# Patient Record
Sex: Female | Born: 1965 | Race: White | Hispanic: No | Marital: Married | Smoking: Never smoker
Health system: Southern US, Community
[De-identification: ages and names within clinical notes are randomized; demographics above are authoritative.]

## PROBLEM LIST (undated history)

## (undated) HISTORY — PX: ABDOMINAL HYSTERECTOMY: SHX81

## (undated) HISTORY — PX: BREAST BIOPSY: SHX20

---

## 2004-12-16 ENCOUNTER — Emergency Department: Payer: Self-pay | Admitting: Emergency Medicine

## 2004-12-25 ENCOUNTER — Encounter: Payer: Self-pay | Admitting: Physician Assistant

## 2006-09-01 HISTORY — PX: HYSTEROTOMY: SHX1776

## 2007-07-21 ENCOUNTER — Ambulatory Visit: Payer: Self-pay | Admitting: Ophthalmology

## 2010-06-13 ENCOUNTER — Ambulatory Visit: Payer: Self-pay | Admitting: Family Medicine

## 2010-06-13 DIAGNOSIS — F17200 Nicotine dependence, unspecified, uncomplicated: Secondary | ICD-10-CM

## 2010-08-30 ENCOUNTER — Ambulatory Visit
Admission: RE | Admit: 2010-08-30 | Discharge: 2010-08-30 | Payer: Self-pay | Source: Home / Self Care | Attending: Family Medicine | Admitting: Family Medicine

## 2010-10-01 NOTE — Assessment & Plan Note (Signed)
Summary: CONGESTION/JBB   Vital Signs:  Patient Profile:   45 Years Old Female CC:      Cough and Left ear Ache / rwt Height:     64.5 inches Weight:      185 pounds BMI:     31.38 O2 Sat:      96 % O2 treatment:    Room Air Temp:     98.1 degrees F oral Pulse rate:   73 / minute Pulse rhythm:   regular Resp:     20 per minute BP sitting:   122 / 76  (right arm)  Pt. in pain?   yes    Location:   head    Intensity:   5    Type:       aching  Vitals Entered By: Levonne Spiller EMT-P (June 13, 2010 5:04 PM)              Is Patient Diabetic? No Comments Pt. is a smoker. 6-8 cigarettes per day.      Current Allergies: No known allergies History of Present Illness History from: patient Reason for visit: see chief complaint Chief Complaint: Cough and Left ear Ache / rwt History of Present Illness: This patient is a new patient to the clinic reporting that for the past week she has been experiencing symptoms of wheezing, coughing and runny nose and left ear pain which started more than one week ago. The patient is a smoker and reports less than one pack cigarettes  per day and has been smoking for more than 7 years. The patient reports no fever or chills but reports that she's having a productive cough of whitish sputum. The patient reports that the cough is worse in the morning and at night. The patient reports that she has been relatively healthy and does not take chronic medications. The patient reports that she has tried over-the-counter medications including Mucinex and medicine to help her sleep. She reports that none of these medications have helped.  REVIEW OF SYSTEMS Constitutional Symptoms      Denies fever, chills, night sweats, weight loss, weight gain, and fatigue.  Eyes       Denies change in vision, eye pain, eye discharge, glasses, contact lenses, and eye surgery. Ear/Nose/Throat/Mouth       Complains of ear pain and sore throat.      Denies hearing loss/aids,  change in hearing, ear discharge, dizziness, frequent runny nose, frequent nose bleeds, sinus problems, hoarseness, and tooth pain or bleeding.  Respiratory       Complains of dry cough, productive cough, wheezing, and shortness of breath.      Denies asthma, bronchitis, and emphysema/COPD.  Cardiovascular       Denies murmurs, chest pain, and tires easily with exhertion.    Gastrointestinal       Denies stomach pain, nausea/vomiting, diarrhea, constipation, blood in bowel movements, and indigestion. Genitourniary       Denies painful urination, kidney stones, and loss of urinary control. Neurological       Denies paralysis, seizures, and fainting/blackouts. Musculoskeletal       Denies muscle pain, joint pain, joint stiffness, decreased range of motion, redness, swelling, muscle weakness, and gout.  Skin       Denies bruising, unusual mles/lumps or sores, and hair/skin or nail changes.  Psych       Denies mood changes, temper/anger issues, anxiety/stress, speech problems, depression, and sleep problems.  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 The patient works in [employer]. The patient reports smoking less than one pack of cigarettes per day for over 7 days and denies alcohol and recreational drug use. 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 The patient works in [employer]. The patient reports smoking less than one pack of cigarettes per day for  over 7 days and denies alcohol and recreational drug use. Current Smoker Alcohol use-no Drug use-no Smoking Status:  current Drug Use:  no Packs/Day:  0.25 Physical Exam General appearance: well developed, well nourished, no acute distress Head: normocephalic, atraumatic Eyes: conjunctivae and lids normal Pupils: equal, round, reactive to light Ears: excessive cerumen left Nasal: swollen red turbinates with congestion Oral/Pharynx: tongue normal, posterior pharynx without erythema or exudate Neck: neck supple,  trachea midline, no masses Chest/Lungs: scattered wheezes throughout all lobes Heart: regular rate and  rhythm, no murmur Abdomen: soft, non-tender without obvious organomegaly Extremities: normal extremities Neurological: grossly intact and non-focal Skin: no obvious rashes or lesions MSE: oriented to time, place, and person Assessment New Problems: ACUTE SINUSITIS, UNSPECIFIED (ICD-461.9) ACUTE BRONCHITIS (ICD-466.0) SMOKER (ICD-305.1) FAMILY HISTORY DIABETES 1ST DEGREE RELATIVE (ICD-V18.0)   Patient Education: Patient and/or caregiver instructed in the following: fluids, Ibuprofen prn, rest fluids and Tylenol. The patient was counseled and advised to stop using all tobacco products.  Medical assistance was offered and the patient was encouraged to call 1-800-QUIT-NOW to get a smoking cessation coach.    Demonstrates willingness to comply.  Plan New Medications/Changes: FLUTICASONE PROPIONATE 50 MCG/ACT SUSP (FLUTICASONE PROPIONATE) 2 sprays per nostril once daily  #1 x 0, 06/13/2010, Clanford Johnson MD GUIATUSS AC 100-10 MG/5ML SYRP (GUAIFENESIN-CODEINE) take 1 teaspoon by mouth every 6-8 hours as needed severe cough and congestion, Will Cause Drowsiness  #100 mL x 0, 06/13/2010, Clanford Johnson MD BIAXIN XL PAC 500 MG XR24H-TAB (CLARITHROMYCIN) take 2 tabs by mouth every morning with breakfast  #20 x 0, 06/13/2010, Standley Dakins MD  New Orders: Nebulizer Tx  [phone] Follow Up: Follow up on an as needed basis, Follow up with Primary Physician  The patient and/or caregiver has been counseled thoroughly with regard to medications prescribed including dosage, schedule, interactions, rationale for use, and possible side effects and they verbalize understanding.  Diagnoses and expected course of recovery discussed and will return if not improved as expected or if the condition worsens. Patient and/or caregiver verbalized understanding.  Prescriptions: FLUTICASONE PROPIONATE 50 MCG/ACT SUSP (FLUTICASONE PROPIONATE) 2 sprays per nostril once daily  #1 x 0   Entered and Authorized by:   Standley Dakins MD   Signed by:   Standley Dakins MD on 06/13/2010   Method used:   Electronically to        [facility] [address] (retail)       [address], [address]       [place], [place]  [address]       Ph: [phone]       Fax: [phone]   RxID:   [id number] GUIATUSS AC 100-10 MG/5ML SYRP (  GUAIFENESIN-CODEINE) take 1 teaspoon by mouth every 6-8 hours as needed severe cough and congestion, Will Cause Drowsiness  #100 mL x 0   Entered and Authorized by:   Standley Dakins MD   Signed by:   Standley Dakins MD on 06/13/2010   Method used:   Print then Give to Patient   RxID:   [id number] BIAXIN XL PAC 500 MG XR24H-TAB (CLARITHROMYCIN) take 2 tabs by mouth every morning with breakfast  #20 x 0   Entered and Authorized by:   Standley Dakins MD   Signed by:   Standley Dakins MD on 06/13/2010   Method used:   Electronically to        [facility] [address] (retail)       [address], [address]       [place], [place]  [address]       Ph: [phone]       Fax: [phone]   RxID:   [id number]   Patient Instructions: 1)  The patient was counseled and advised to stop using all tobacco products.  Medical assistance was offered and the patient was encouraged to call 1-800-QUIT-NOW  to get a smoking cessation coach.    2)  The risks, benefits and possible side effects were clearly explained and discussed with the patient.  The patient verbalized clear understanding.  The patient was given instructions to return if symptoms don't improve, worsen or new changes develop.  If it is not during clinic hours and the patient cannot get back to this clinic then the patient was told to seek medical care at an available urgent care or emergency department.  The patient verbalized understanding.   3)  The patient was informed that there is no on-call provider or services available at this clinic during off-hours (when the clinic is closed).  If the patient developed a problem or concern that required immediate attention, the patient was advised to go the the nearest available urgent care or emergency department for medical care.  The patient verbalized understanding.    4)  It was clearly explained to the patient that this [facility] is not intended to be a primary care clinic.  The patient is always better served by the continuity of care and the provider/patient relationships developed with their dedicated primary care provider.  The patient was told to be sure to follow up as soon as possible with their primary care provider to discuss treatments received and to receive further examination and testing.  The patient verbalized understanding. The will f/u with PCP ASAP.  5)  Tobacco is very bad for your health and your loved ones! You Should stop smoking!. 6)  Stop Smoking Tips: Choose a Quit date. Cut down before the Quit date. decide what you will do as a substitute when you feel the urge to smoke (gum,toothpick,exercise).  Medication Administration  Medication # 1:    Medication: Atrovent 1mg  (Neb)  Medication # 2:    Medication: Albuterol Sulfate Sol 1mg  unit dose  Orders Added: 1)  Nebulizer Tx [94640]    Preventive Screening-Counseling & Management  Alcohol-Tobacco      Smoking Status: current     Smoking Cessation Counseling: yes     Smoke Cessation Stage: precontemplative     Packs/Day: 0.25     Year Started: 2005     Tobacco Counseling: to quit use of tobacco products      Drug Use:  no.     After the nebulizer treatment was completed, I listened to the patient's lungs and she had improved air movement bilaterally with much less wheezing.  Rodney Langton, M.D., F.A.A.F.P.  June 13, 2010

## 2010-10-03 NOTE — Assessment & Plan Note (Signed)
Summary: COUGH AND COLD/EVM   Vital Signs:  Patient profile:   45 year old female Height:      65 inches Weight:      189 pounds BMI:     31.56 O2 Sat:      97 % on Room air Temp:     98.4 degrees F oral Pulse rate:   78 / minute Pulse rhythm:   regular Resp:     22 per minute BP sitting:   116 / 64  (right arm)  Vitals Entered By: Levonne Spiller EMT-P (August 30, 2010 4:21 PM)  O2 Flow:  Room air CC: URI/ Cold Is Patient Diabetic? No Pain Assessment Patient in pain? no       Does patient need assistance? Functional Status Self care Ambulation Normal Comments Pt. is a smoker. 1 half pack per day. Pt. has minor wheezing Right lower lobe.   Chief Complaint:  URI/ Cold.  History of Present Illness: Pt says that she has been coughing since Christmas eve with wheezing and congestion, worse than it was when she seen here in October.  Pt did have a fever when it first started and then took tylenol and it improved.  Pt is coughing but not having any production.  She is having yellow/green sinus drainage and reports congestion in chest but nothing has been coming up.  pt says that she has been trying to to treat the infection on her own but no significant improvement with over-the-counter medications. She reports that she's been taking decongestants and over-the-counter cough medications but no significant improvement. She reports that she came in because she did not want to develop into a worse infection or pneumonia. She reports that she has been exposed to some sick people at home and at work. She reports that her son has also been sick with a upper respiratory infection.   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  The patient works in [employer]. The patient reports smoking less than one pack of cigarettes per day for over 7 days and denies alcohol and recreational drug use. Current Smoker Alcohol use-no Drug use-no  Risk Factors: Smoking Status: current (08/30/2010) Packs/Day: 0.25 (06/13/2010)  Past Surgical History: Reviewed history from 06/13/2010 and no changes required. Tubal ligation - 1994  Family History: Reviewed history from 06/13/2010 and no changes required. 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  The patient works in [employer]. The patient reports smoking less than one pack of cigarettes per day for over 7 days and denies alcohol and recreational drug use. Current Smoker Alcohol use-no Drug use-no  Review of Systems General:  Denies chills, fatigue, fever, loss of appetite, malaise, sleep disorder, sweats, weakness, and weight loss. Eyes:  Denies blurring, discharge, double vision, eye irritation, eye pain, halos, itching, light sensitivity, red eye, vision loss-1 eye, and vision loss-both eyes. ENT:  Complains of nasal  congestion; denies decreased hearing, difficulty swallowing, ear discharge, earache, hoarseness, nosebleeds, postnasal drainage, ringing in ears, sinus pressure, and sore throat. CV:  Denies bluish discoloration of lips or nails, chest pain or discomfort, difficulty breathing at night, difficulty breathing while lying down, fainting, fatigue, leg cramps with exertion, lightheadness, near fainting, palpitations, shortness of  breath with exertion, swelling of feet, swelling of hands, and weight gain. Resp:  Complains of cough; denies chest discomfort, chest pain with inspiration, coughing up blood, excessive snoring, hypersomnolence, morning headaches, pleuritic, shortness of breath, and wheezing; Dry Cough. GI:  Denies abdominal pain, bloody stools, change in bowel habits, constipation, dark tarry stools, diarrhea, excessive appetite, gas, hemorrhoids, indigestion, loss of appetite, nausea, vomiting, vomiting blood, and yellowish skin color. GU:  Denies abnormal vaginal bleeding, decreased libido, discharge, dysuria, genital sores, hematuria, incontinence, nocturia, urinary frequency, and urinary hesitancy. MS:  Denies joint pain, joint redness, joint swelling, loss of strength, low back pain, mid back pain, muscle aches, muscle , cramps, muscle weakness, stiffness, and thoracic pain. Derm:  Denies changes in color of skin, changes in nail beds, dryness, excessive perspiration, flushing, hair loss, insect bite(s), itching, lesion(s), poor wound healing, and rash. Neuro:  Denies brief paralysis, difficulty with concentration, disturbances in coordination, falling down, headaches, inability to speak, memory loss, numbness, poor balance, seizures, sensation of room spinning, tingling, tremors, visual disturbances, and weakness. Psych:  Denies alternate hallucination ( auditory/visual), anxiety, depression, easily angered, easily tearful, irritability, mental problems, panic attacks, sense of great danger, suicidal thoughts/plans, thoughts of violence, unusual visions or sounds, and thoughts /plans of harming others. Endo:  Denies cold intolerance, excessive hunger, excessive thirst, excessive urination, heat intolerance, polyuria, and weight change. Heme:  Denies abnormal bruising, bleeding, enlarge lymph nodes, fevers, pallor, and skin discoloration. Allergy:  Denies hives or rash, itching eyes, persistent infections, seasonal  allergies, and sneezing.  Physical Exam  General:  Well-developed,well-nourished,in no acute distress; alert,appropriate and cooperative throughout examination Head:  Normocephalic and atraumatic without obvious abnormalities. No apparent alopecia or balding. Eyes:  No corneal or conjunctival inflammation noted. EOMI. Perrla. Funduscopic exam benign, without hemorrhages, exudates or papilledema. Vision grossly normal. Ears:  External ear exam shows no significant lesions or deformities.  Otoscopic examination reveals clear canals, tympanic membranes are intact bilaterally without bulging, retraction, inflammation or discharge. Hearing is grossly normal bilaterally. Nose:  bilateral swollen nasal turbinates with thick yellow mucus noted Mouth:  dry mucous membranes with same mildly erythematous posterior pharynx but no exudate seen Neck:  supple, full ROM, and no masses.   Lungs:  bilateral expiratory wheezes heard, , improved after nebulizer treatment.  the patient does have a frequent dry hacking cough Heart:  Normal rate and regular rhythm. S1 and S2 normal without gallop, murmur, click, rub or other extra sounds. Abdomen:  Bowel sounds positive,abdomen soft and non-tender without masses, organomegaly or hernias noted. Msk:  No deformity or scoliosis noted of thoracic or lumbar spine.   Extremities:  No clubbing, cyanosis, edema, or deformity noted with normal full range of motion of all joints.   Neurologic:  No cranial nerve deficits noted. Station and gait are normal. Plantar reflexes are down-going bilaterally. DTRs are symmetrical throughout. Sensory, motor and coordinative functions appear intact. Skin:  Intact without suspicious lesions or rashes Cervical Nodes:  mild anterior cervical adenopathy-nontender Psych:  Cognition and judgment appear intact. Alert and cooperative with normal attention span and concentration. No apparent delusions, illusions,  hallucinations   Problems:  Medical Problems Added: 1)  Dx of Acute Bronchitis  (ICD-466.0) 2)  Dx of Acute Sinusitis, Unspecified  (ICD-461.9)  Impression & Recommendations:  Problem # 1:  ACUTE BRONCHITIS (ICD-466.0)  Her updated medication list for this problem includes:        Azithromycin 250 Mg Tabs (Azithromycin) .Marland Kitchen... Take 2 tabs by mouth on day 1, then 1 tab by mouth daily for 4 days    Ventolin Hfa 108 (90 Base) Mcg/act Aers (Albuterol sulfate) .Marland Kitchen... 2 puffs every 4 hours as needed cough, sob, wheezing    Guiatuss Ac 100-10 Mg/95ml Syrp (Guaifenesin-codeine) .Marland Kitchen... Take 1 teaspoon by mouth every 4-6 hours as needed severe cough and congestion: caution will cause drowsiness The patient was counseled and advised to stop using all tobacco products.  Medical assistance was offered and the patient was encouraged to call 1-800-QUIT-NOW to get a smoking cessation coach.     Problem # 2:  ACUTE SINUSITIS, UNSPECIFIED (ICD-461.9)  Her updated medication list for this problem includes:        Azithromycin 250 Mg Tabs (Azithromycin) .Marland Kitchen... Take 2 tabs by mouth on day 1, then 1 tab by mouth daily for 4 days    Guiatuss Ac 100-10 Mg/62ml Syrp (Guaifenesin-codeine) .Marland Kitchen... Take 1 teaspoon by mouth every 4-6 hours as needed severe cough and congestion: caution will cause drowsiness  Problem # 3:  SMOKER (ICD-305.1) The patient was counseled and advised to stop using all tobacco products.  Medical assistance was offered and the patient was encouraged to call 1-800-QUIT-NOW to get a smoking cessation coach.     Complete Medication List: 1)  Coricidin Hbp Congestion/cough 10-200 Mg Caps (Dextromethorphan-guaifenesin) .Marland Kitchen.. 1 cap every 4-6 hours 2)  Tussin Dm 100-10 Mg/14ml Syrp (Dextromethorphan-guaifenesin) .... 2 tsp every 4-6 hours 3)  Azithromycin 250 Mg Tabs (Azithromycin) .... Take 2 tabs by mouth on day 1, then 1 tab by mouth daily for 4 days 4)  Ventolin Hfa 108 (90 Base) Mcg/act Aers  (Albuterol sulfate) .... 2 puffs every 4 hours as needed cough, sob, wheezing 5)  Guiatuss Ac 100-10 Mg/18ml Syrp (Guaifenesin-codeine) .... Take 1 teaspoon by mouth every 4-6 hours as needed severe cough and congestion: caution will cause drowsiness 6)  Medrol Dosepak 4 Mg Tabs (methylprednisolone)  .... Take as directed  Patient Instructions: 1)  Tobacco is very bad for your health and your loved ones! You Should stop smoking!. 2)  Stop Smoking Tips: Choose a Quit date. Cut down before the Quit date. decide what you will do as a substitute when you feel the urge to smoke(gum,toothpick,exercise). 3)  Take your antibiotic as prescribed until ALL of it is gone, but stop if you develop a rash or swelling and contact our office as soon as possible. 4)  Acute sinusitis symptoms for less than 10 days are not helped by antibiotics.Use warm moist compresses, and over the counter decongestants ( only as directed). Call if no improvement in 5-7 days, sooner if increasing pain, fever, or new symptoms. 5)  Acute bronchitis symptoms for less than 10 days are not helped by antibiotics. take over the counter cough medications. call if no improvment in  5-7 days, sooner if increasing cough, fever, or new symptoms( shortness of breath, chest pain). 6)  Return if symptoms worsen or don't improve. 7)  Please stop all of the over-the-counter medications that you have been taking. Prescriptions: MEDROL DOSEPAK 4 MG TABS (METHYLPREDNISOLONE) take as directed  #1 x 0   Entered and Authorized by:   [employer]  MD   Signed by:   Standley Dakins MD on 08/30/2010   Method used:   Print then Give to Patient   RxID:   [id number] GUIATUSS AC 100-10 MG/5ML SYRP (GUAIFENESIN-CODEINE) take 1 teaspoon by mouth every 4-6 hours as needed severe cough and congestion: Caution Will Cause Drowsiness  #60 mL x 0   Entered and Authorized by:   Standley Dakins MD   Signed by:   Standley Dakins MD on 08/30/2010   Method  used:   Print then Give to Patient   RxID:   [id number] VENTOLIN HFA 108 (90 BASE) MCG/ACT AERS (ALBUTEROL SULFATE) 2 puffs every 4 hours as needed Cough, SOB, wheezing  #1 x 0   Entered and Authorized by:   Standley Dakins MD   Signed by:   Standley Dakins MD on 08/30/2010   Method used:   [facility]ron[facility]        [facility]       [address]       [place], [place]  [address]       Ph: [phone]       Fax: [phone]   RxID:   [id number] AZITHROMYCIN 250 MG TABS (AZITHROMYCIN) take 2 tabs by mouth on day 1, then 1 tab by mouth daily for 4 days  #6 x 0   Entered and Authorized by:   Standley Dakins MD   Signed by:   Standley Dakins MD on 08/30/2010   Method used:   Electronically to        [facility]       [address]       [place], [place]  [address]       Ph: [phone]       Fax: [phone]   RxID:   [id number]    Medication Administration  Injection # 1:    Medication: Solumedrol up to 125mg     Diagnosis: URI    Route: IM    Site: LUOQ gluteus    Exp Date: 02/28/2013    Lot #: [id number]    Mfr: [email or web address]    Patient tolerated injection without complications    Given by: Levonne Spiller EMT-P (August 30, 2010 4:58 PM)  Medication # 1:    Medication: Albuterol Sulfate Sol 3.0mg     Diagnosis: Wheezing    Dose: 1    Route: inhaled    Exp Date: 12/30/2010    Lot #: [id number]    Mfr: SANDOZ    Patient tolerated medication without complications    Given by: Levonne Spiller EMT-P (August 30, 2010 5:00 PM)  Medication # 2:    Medication: Ipratropium inhalation sol.  0.5mg     Diagnosis: Wheezing    Dose: 1    Route: inhaled    Exp Date: 12/30/2010    Lot #: [id number]    Mfr: SANDOZ    Patient tolerated medication without complications    Given by: Levonne Spiller EMT-P (August 30, 2010 5:01 PM)

## 2010-10-16 ENCOUNTER — Ambulatory Visit (INDEPENDENT_AMBULATORY_CARE_PROVIDER_SITE_OTHER): Payer: 59 | Admitting: Family Medicine

## 2010-10-16 ENCOUNTER — Encounter: Payer: Self-pay | Admitting: Family Medicine

## 2010-10-16 DIAGNOSIS — IMO0002 Reserved for concepts with insufficient information to code with codable children: Secondary | ICD-10-CM

## 2010-10-16 DIAGNOSIS — A4902 Methicillin resistant Staphylococcus aureus infection, unspecified site: Secondary | ICD-10-CM

## 2010-10-23 NOTE — Letter (Signed)
Summary: Out of Work  [facility]  [address]   [place], [place] [address]   Phone: [phone]  Fax: [phone]    October 16, 2010   Employee:  ALLEENE STOY    To Whom It May Concern:   For Medical reasons, please excuse the above named employee from work for the following dates:  Start:   October 16, 2010    End:   October 18, 2010  If you need additional information, please feel free to contact our office.         Sincerely,    Standley Dakins MD

## 2010-10-23 NOTE — Assessment & Plan Note (Signed)
Summary: POSSIBLE SPIDER BITE/EVM   Vital Signs:  Patient profile:   45 year old female Temp:     98.9 degrees F oral Pulse rate:   88 / minute Resp:     14 per minute  Chief Complaint:  check spider bite.  History of Present Illness: The patient presented today for an evaluation of a possible spider bite on the right shoulder that has started getting larger in size and causing some pain and discomfort.  It has been red and hot and irritated and overnight became larger.  It did not respond to topical hydrocortisone creme that the patient had applied.  The patient said that she had been working outsided when she believed she may have been bitten by a spider or some type of insect.  She says that this morning she has been feeling sick to her stomach but no fever or chills.  No vomiting or diarrhea reported.  She is really afraid because her brother lost a  leg from a brown recluse spider bite and has since expired.    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  The patient works in [employer]. The patient reports smoking less than one pack of cigarettes per day for over 7 days and denies alcohol and recreational drug use. Current Smoker Alcohol use-no Drug use-no  Risk Factors: Smoking Status: current (08/30/2010) Packs/Day: 0.25 (06/13/2010)  Past Medical History: Reviewed history from 06/13/2010 and no changes required. Chronic Active Nicotine Dependence  Past Surgical History: Reviewed history from 06/13/2010 and no changes required. Tubal ligation - 1994  Family History: Reviewed history from 06/13/2010 and no changes required. Family History Diabetes 1st degree relative - both parents affected  Social  History: Reviewed history from 08/30/2010 and no changes required. 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  The patient works in [employer]. The patient reports smoking less than one pack of cigarettes per day for over 7 days and denies alcohol and recreational drug use. Current Smoker Alcohol use-no Drug use-no  Review of Systems General:  Complains of malaise; denies chills, fatigue, fever, loss of appetite, sleep disorder, sweats, weakness, and weight loss. Eyes:  Denies blurring, discharge, double vision, eye irritation, eye pain, halos, itching, light sensitivity, red eye, vision loss-1 eye, and vision loss-both eyes. ENT:  Denies decreased hearing, difficulty swallowing, ear discharge, earache, hoarseness, nasal congestion, nosebleeds, postnasal drainage, ringing in ears, sinus pressure, and sore throat. CV:  Denies bluish discoloration of lips or nails, chest pain or discomfort, difficulty breathing at night, difficulty breathing while lying down, fainting, fatigue, leg cramps with exertion, lightheadness, near fainting, palpitations, shortness of breath with exertion, swelling of feet, swelling of hands, and weight gain. Resp:  Denies chest discomfort, chest pain with inspiration, cough, coughing up blood, excessive snoring, hypersomnolence, morning headaches, pleuritic, shortness of breath, sputum productive, and wheezing. GI:  Denies abdominal pain, bloody stools, change in bowel habits, constipation, dark tarry stools, diarrhea, excessive appetite, gas, hemorrhoids, indigestion, loss of appetite, nausea, vomiting, vomiting blood, and yellowish skin color. GU:  Denies abnormal vaginal bleeding, decreased libido, discharge, dysuria, genital sores, hematuria, incontinence, nocturia, urinary frequency, and urinary hesitancy. MS:  Complains of joint redness; denies joint pain, joint swelling, loss of strength, low  back pain, mid back pain,  muscle aches, muscle , cramps, muscle weakness, stiffness, and thoracic pain; right shoulder at the area of the insect bite. Derm:  Complains of changes in color of skin, insect bite(s), lesion(s), and rash; right shoulder. Neuro:  Denies brief paralysis, difficulty with concentration, disturbances in coordination, falling down, headaches, inability to speak, memory loss, numbness, poor balance, seizures, sensation of room spinning, tingling, tremors, visual disturbances, and weakness. Psych:  Denies alternate hallucination ( auditory/visual), anxiety, depression, easily angered, easily tearful, irritability, mental problems, panic attacks, sense of great danger, suicidal thoughts/plans, thoughts of violence, unusual visions or sounds, and thoughts /plans of harming others. Endo:  Denies cold intolerance, excessive hunger, excessive thirst, excessive urination, heat intolerance, polyuria, and weight change.  Physical Exam  General:  Well-developed,well-nourished,in no acute distress; alert,appropriate and cooperative throughout examination Head:  Normocephalic and atraumatic without obvious abnormalities. No apparent alopecia or balding. Eyes:  No corneal or conjunctival inflammation noted. EOMI. Perrla. Funduscopic exam benign, without hemorrhages, exudates or papilledema. Vision grossly normal. Ears:  External ear exam shows no significant lesions or deformities.  Otoscopic examination reveals clear canals, tympanic membranes are intact bilaterally without bulging, retraction, inflammation or discharge. Hearing is grossly normal bilaterally. Nose:  bilateral swollen nasal turbinates with thick yellow mucus noted Msk:  red, hot, irritated area of cellulitis on top of right shoulder - round area about 5 cm in diameter, small area of puncture seen, small amount of secretion seen; tender to palpation, no fluctuant area or absecess formed that could be palpated; Pulses:  R and L  carotid,radial,femoral,dorsalis pedis and posterior tibial pulses are full and equal bilaterally Neurologic:  alert & oriented X3 and gait normal.     Impression & Recommendations:  Problem # 1:  CELLULITIS AND ABSCESS OF UPPER ARM AND FOREARM (ICD-682.3)  Her updated medication list for this problem includes:    Bactrim Ds 800-160 Mg Tabs (Sulfamethoxazole-trimethoprim) .Marland Kitchen... Take 1 by mouth two times a day until completed  I explained to the patient to please return if not getting better or if the area of infection is enlarging, becoming more painful or any new changes that might develop.  The patient verbalized clear understanding.    The risks, benefits and possible side effects were clearly explained and discussed with the patient.  The patient verbalized clear understanding.  The patient was given instructions to return if symptoms don't improve, worsen or new changes develop.  If it is not during clinic hours and the patient cannot get back to this clinic then the patient was told to seek medical care at an available urgent care or emergency department.  The patient verbalized understanding.    Problem # 2:  R/O MRSA (EAV-409.81) Precautions discussed with the patient.  The patient verbalized clear understanding.    Complete Medication List: 1)  Coricidin Hbp Congestion/cough 10-200 Mg Caps (Dextromethorphan-guaifenesin) .Marland Kitchen.. 1 cap every 4-6 hours 2)  Tussin Dm 100-10 Mg/15ml Syrp (Dextromethorphan-guaifenesin) .... 2 tsp every 4-6 hours 3)  Ventolin Hfa 108 (90 Base) Mcg/act Aers (Albuterol sulfate) .... 2 puffs every 4 hours as needed cough, sob, wheezing 4)  Medrol Dosepak 4 Mg Tabs (methylprednisolone)  .... Take as directed 5)  Bactrim Ds 800-160 Mg Tabs (Sulfamethoxazole-trimethoprim) .... Take 1 by mouth two times a day until completed 6)  Mupirocin 2 % Oint (Mupirocin) .... Apply to lesion two times a day until resolved  Patient Instructions: 1)  Go to the pharmacy and pick  up your  prescription (s).  It may take up to 30 mins for electronic prescriptions to be delivered to the pharmacy.  Please call if your pharmacy has not received your prescriptions after 30 minutes.   2)  Take your antibiotic as prescribed until ALL of it is gone, but stop if you develop a rash or swelling and contact our office as soon as possible. 3)  Take MRSA precautions as we discussed in clinic. 4)  Return or go to the ER if no improvement or symptoms getting worse.   5)  The patient was informed that there is no on-call provider or services available at this clinic during off-hours (when the clinic is closed).  If the patient developed a problem or concern that required immediate attention, the patient was advised to go the the nearest available urgent care or emergency department for medical care.  The patient verbalized understanding.    Prescriptions: MUPIROCIN 2 % OINT (MUPIROCIN) apply to lesion two times a day until resolved  #22 gm x 0   Entered and Authorized by:   Standley Dakins MD   Signed by:   Standley Dakins MD on 10/16/2010   Method used:   Electronically to        [facility] [address] (retail)       [address], [address]       [place], [place]  [address]       Ph: [phone]       Fax: [phone]   RxID:   [id number] BACTRIM DS 800-160 MG TABS (SULFAMETHOXAZOLE-TRIMETHOPRIM) take 1 by mouth two times a day until completed  #20 x 0   Entered and Authorized by:   Standley Dakins MD   Signed by:   Standley Dakins MD on 10/16/2010   Method used:   Electronically to        [facility] [address] (retail)       [address], [address]       [place], [place]  [address]       Ph: [phone]       Fax: [phone]   RxID:   [id number]   WOUND CARE - IN OFFICE  The wound was marked with black ink.  The wound was assessed carefully for areas of fluctuance and was treated with topical  antibiotic ointment and bandaged appropriately.  Wound care instructions provided to patient. The patient verbalized clear understanding.

## 2010-10-23 NOTE — Letter (Signed)
Summary: Work excuse note  Work excuse note   Imported By: Dorna Leitz 10/17/2010 14:49:10  _____________________________________________________________________  External Attachment:    Type:   Image     Comment:   External Document

## 2010-11-30 ENCOUNTER — Emergency Department: Payer: Self-pay | Admitting: Internal Medicine

## 2010-12-23 ENCOUNTER — Ambulatory Visit: Payer: Self-pay | Admitting: Emergency Medicine

## 2011-02-11 ENCOUNTER — Ambulatory Visit: Payer: Self-pay | Admitting: Emergency Medicine

## 2011-02-21 ENCOUNTER — Ambulatory Visit: Payer: Self-pay | Admitting: Emergency Medicine

## 2014-04-12 ENCOUNTER — Ambulatory Visit: Payer: Self-pay | Admitting: Nurse Practitioner

## 2014-05-04 ENCOUNTER — Ambulatory Visit: Payer: Self-pay | Admitting: Orthopedic Surgery

## 2014-10-23 ENCOUNTER — Ambulatory Visit: Payer: Self-pay | Admitting: Internal Medicine

## 2014-12-23 NOTE — Op Note (Signed)
PATIENT NAME:  Margaret [name], [name] MR#:  [id number] DATE OF BIRTH:  [birth year]  DATE OF PROCEDURE:  05/04/2014  PREOPERATIVE DIAGNOSIS: Right rotator cuff tear.   POSTOPERATIVE DIAGNOSIS: Right rotator cuff tear.  PROCEDURE: Right rotator cuff repair, subacromial decompression.   ANESTHESIA: General.   SURGEON: Kennedy BuckerMichael Anjanae Woehrle, M.D.   DESCRIPTION OF PROCEDURE: The patient was brought to the operating room and after adequate anesthesia was obtained, the patient was placed in a semi-beach chair position with a bump underneath the right shoulder blade. After patient identification and timeout procedures were completed and after sterilely prepping and draping the shoulder, a saber incision was made over the distal acromion and a deltoid on approach made to approach the subacromial space. After the subacromial space was entered, there was a large rotator cuff tear involving the anterior half of the supraspinatus and a portion of subscapularis. The tendon itself was retracted approximately 2 cm and was quite thin.  The biceps tendon was identified as well; it appeared intact in the proximal shoulder.  After debriding the prior attachment to the cuff and debriding the distal stumps of the tendon, a bony bed was created for subsequent attachment of the rotator cuff.  The edge of the torn cuff was also debrided to get a fresh margin.  A suture passer was placed and heavy suture was placed anterior, posterior, in the midportion of the cuff tear. It measured approximately 3.5 cm in length. Following this, three SpeedScrew screws were placed in similar positions.  The SpeedScrew screws were inserted close to the normal insertion approximately 5 mm proximal to the normal insertion of the cuff. The sutures were placed through the speed screws sequentially anterior, middle and then posterior and as they were cinched up they came adjacent to the SpeedScrew itself. There appeared to be good bone to tendon contact from these 3  sutures, but, again, the tendon itself was approximately a third to half normal thickness secondary to the presumed length of time of the tear. After this repair was complete and placing the shoulder through a range of motion, the wound was thoroughly irrigated. Additionally, at the start of the case, after first entering the subacromial space, an oscillating rasp was used to smooth the undersurface of the anterior acromion and a spur at the [facility]. Minimal resection was carried out.  After thorough irrigation, there appeared to be space for the cuff with range of motion and a thick cuff repair appeared stable. The wound was closed with 0 Ethibond for the deltoid, 2-0 Vicryl subcutaneously, and 4-0 nylon for the skin suture. Xeroform, 4 x 4's, ABD, and tape applied along with a sling. The patient was sent to the recovery room in stable condition.   ESTIMATED BLOOD LOSS: Minimal, 25 mL.   COMPLICATIONS: None.   SPECIMEN: None.   IMPLANTS:  ArthroCare SpeedScrew 5.5 mm, knotless anchor x 3.    ____________________________ Leitha SchullerMichael J. Arloa Prak, MD mjm:nr D: 05/04/2014 19:59:04 ET T: 05/04/2014 21:03:04 ET JOB#: [id number]  cc: Leitha SchullerMichael J. Tamari Redwine, MD, <Dictator> Leitha SchullerMICHAEL J Kye Silverstein MD ELECTRONICALLY SIGNED 05/05/2014 8:09

## 2015-02-21 ENCOUNTER — Other Ambulatory Visit: Payer: Self-pay | Admitting: Internal Medicine

## 2015-02-21 ENCOUNTER — Ambulatory Visit
Admission: RE | Admit: 2015-02-21 | Discharge: 2015-02-21 | Disposition: A | Discharge status: Home / Self Care | Payer: Commercial Managed Care - HMO | Source: Ambulatory Visit | Attending: Internal Medicine | Admitting: Internal Medicine

## 2015-02-21 DIAGNOSIS — R229 Localized swelling, mass and lump, unspecified: Secondary | ICD-10-CM | POA: Insufficient documentation

## 2015-08-07 IMAGING — MG MM DIGITAL SCREENING BILAT W/ CAD
5 series · 5 of 5 positions shown · non-contrast
Comparison: Previous exam(s).

CLINICAL DATA: Screening.

EXAM:
DIGITAL SCREENING BILATERAL MAMMOGRAM WITH CAD

[L MLO]
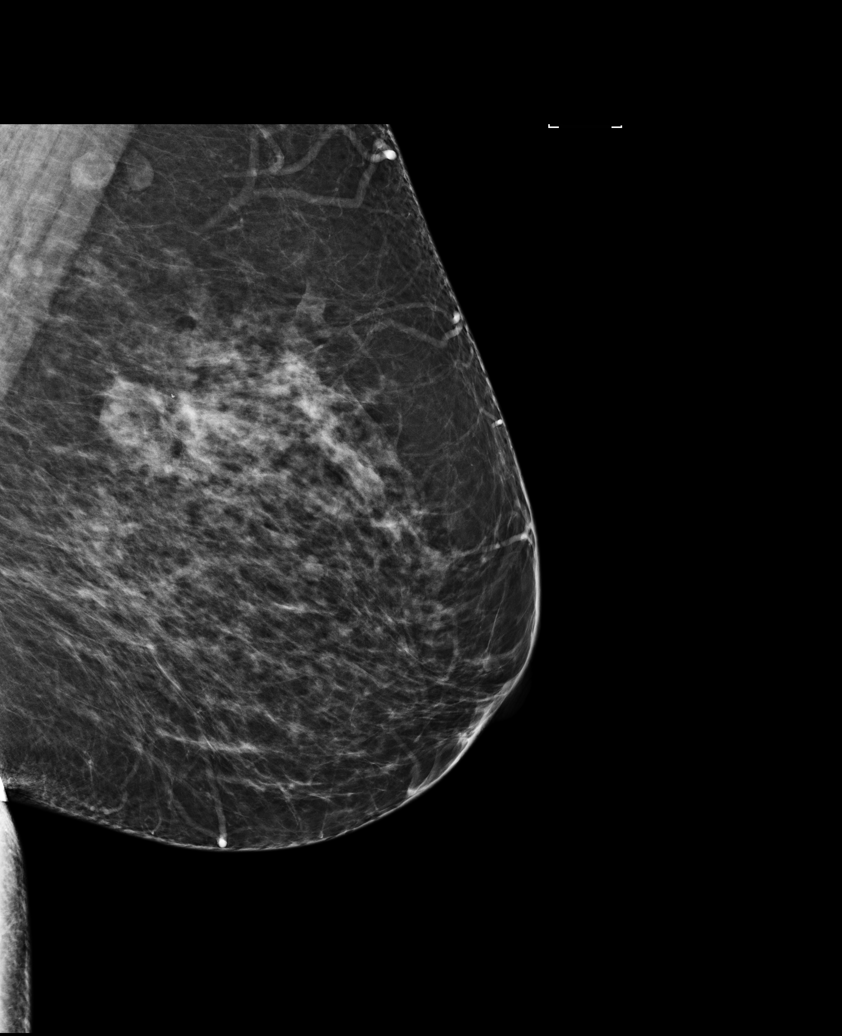

[R MLO (1 of 2)]
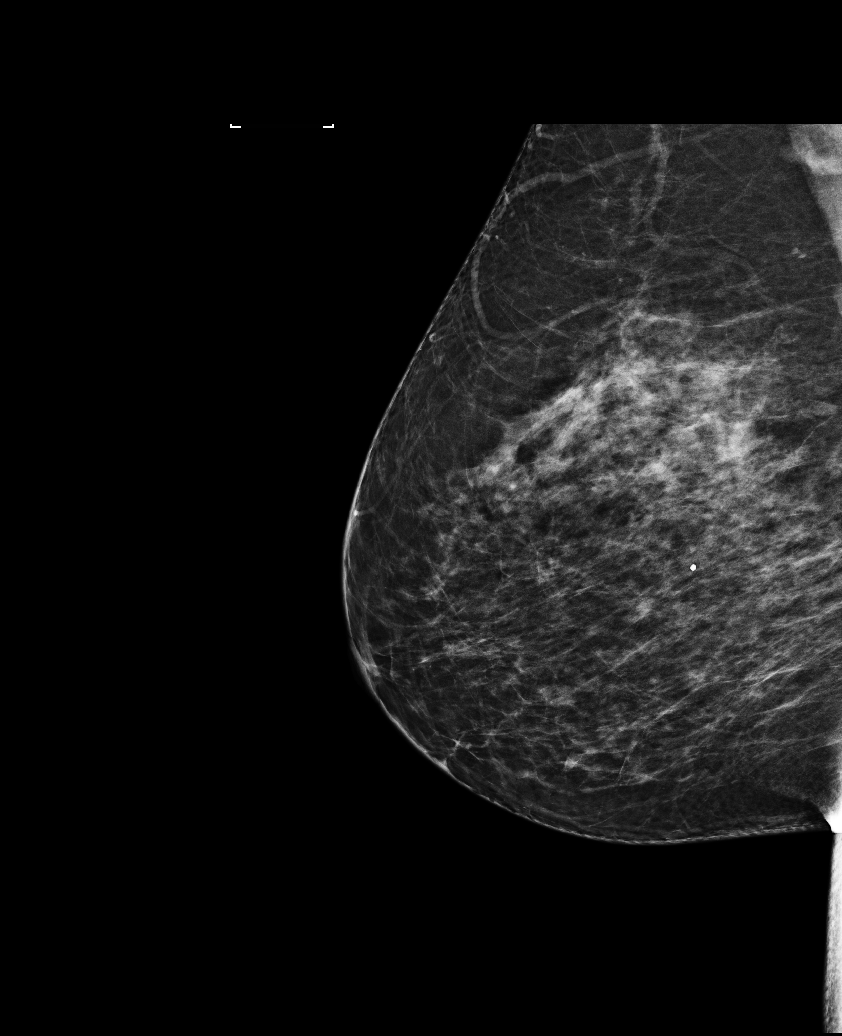

[L CC]
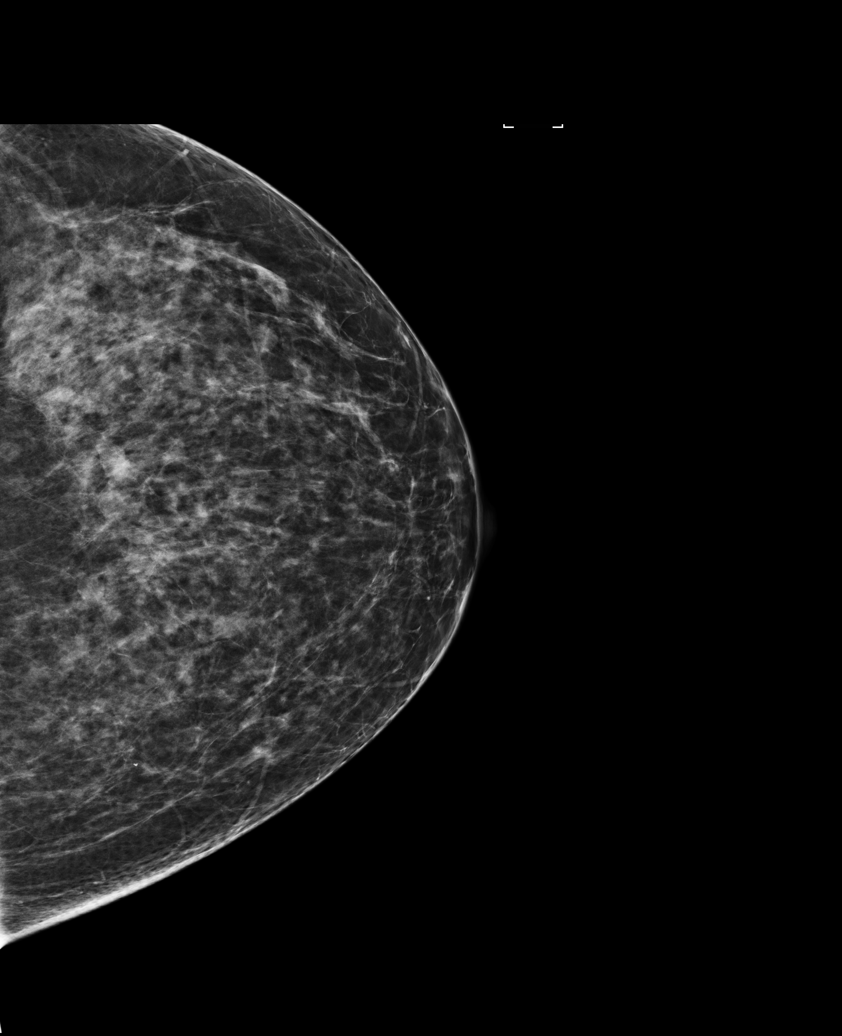

[R CC]
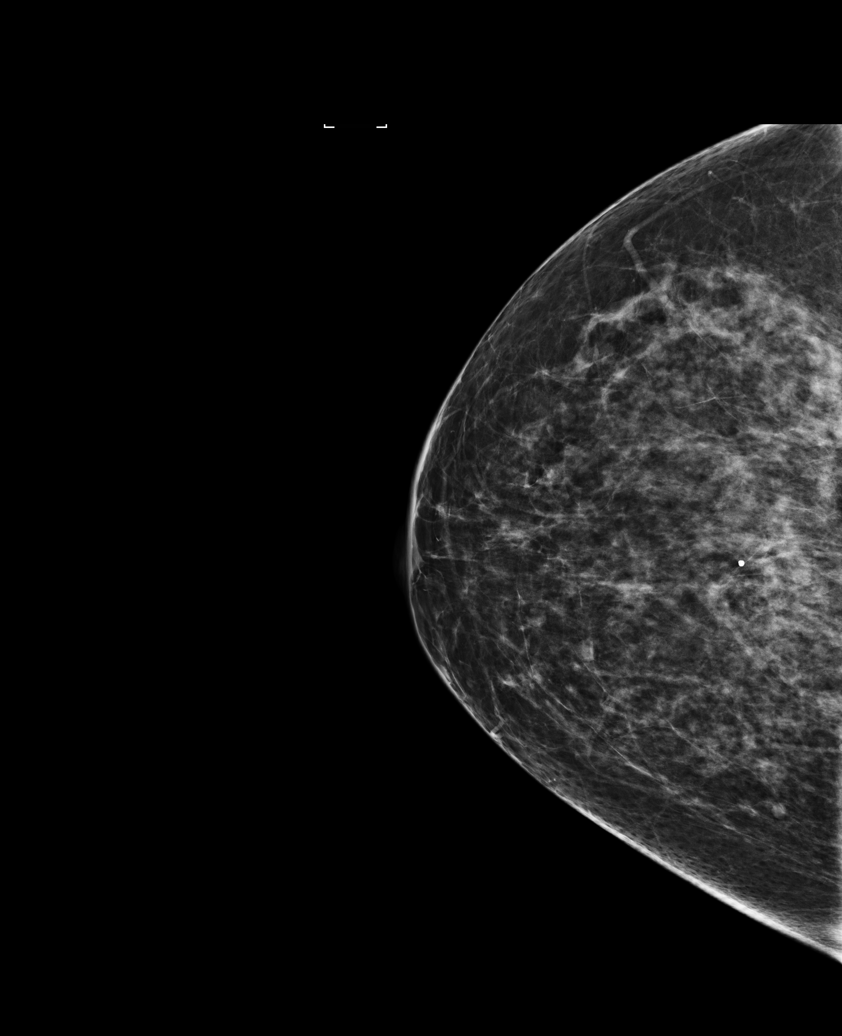

[R MLO (2 of 2)]
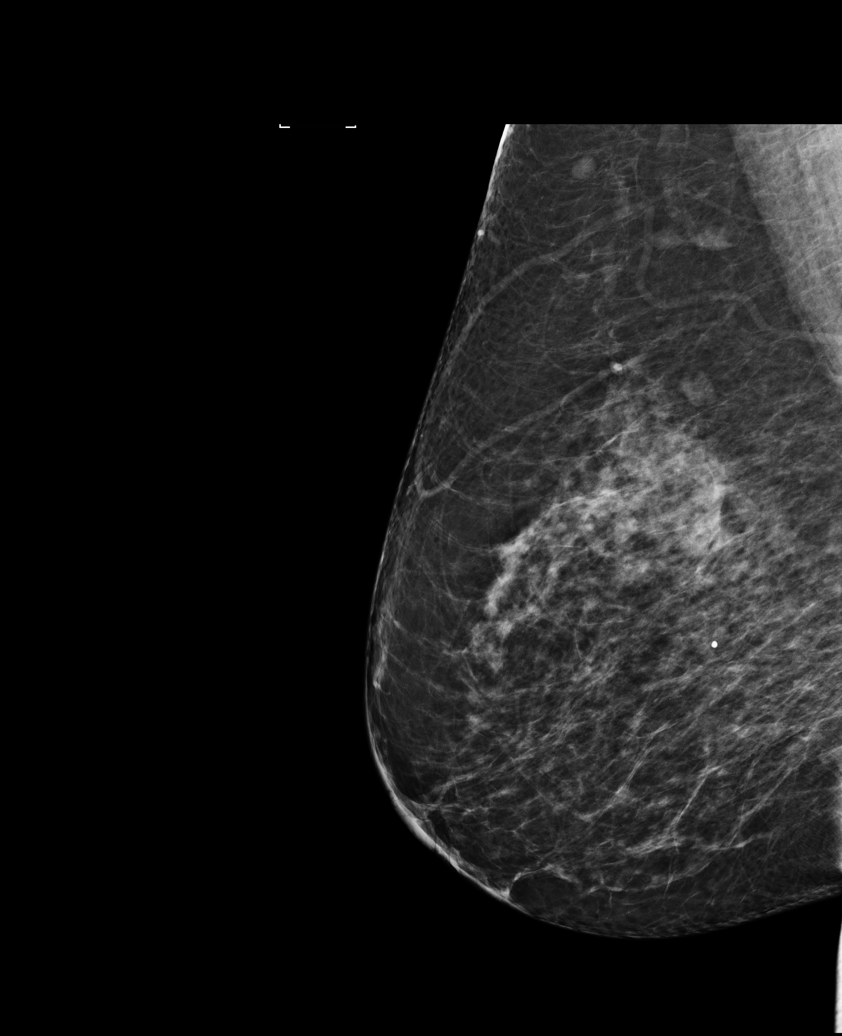

[5 of 5 positions shown; findings below may reference images not displayed]

ACR Breast Density Category c: The breast tissue is heterogeneously
dense, which may obscure small masses.
FINDINGS: There are no findings suspicious for malignancy. Images were
processed with CAD.
IMPRESSION: No mammographic evidence of malignancy. A result letter of this
screening mammogram will be mailed directly to the patient.

RECOMMENDATION:
Screening mammogram in one year. (Code:YJ-2-FEZ)

BI-RADS CATEGORY  1: Negative.

## 2016-11-02 ENCOUNTER — Emergency Department: Payer: 59

## 2016-11-02 ENCOUNTER — Encounter: Payer: Self-pay | Admitting: Emergency Medicine

## 2016-11-02 ENCOUNTER — Inpatient Hospital Stay
Admission: EM | Admit: 2016-11-02 | Discharge: 2016-11-04 | DRG: 494 | Disposition: A | Discharge status: Home / Self Care | Payer: 59 | Attending: Surgery | Admitting: Surgery

## 2016-11-02 DIAGNOSIS — Z886 Allergy status to analgesic agent status: Secondary | ICD-10-CM

## 2016-11-02 DIAGNOSIS — Z87891 Personal history of nicotine dependence: Secondary | ICD-10-CM | POA: Diagnosis not present

## 2016-11-02 DIAGNOSIS — S82891A Other fracture of right lower leg, initial encounter for closed fracture: Secondary | ICD-10-CM | POA: Diagnosis present

## 2016-11-02 DIAGNOSIS — S82851A Displaced trimalleolar fracture of right lower leg, initial encounter for closed fracture: Principal | ICD-10-CM | POA: Diagnosis present

## 2016-11-02 DIAGNOSIS — T148XXA Other injury of unspecified body region, initial encounter: Secondary | ICD-10-CM

## 2016-11-02 DIAGNOSIS — W172XXA Fall into hole, initial encounter: Secondary | ICD-10-CM | POA: Diagnosis present

## 2016-11-02 LAB — COMPREHENSIVE METABOLIC PANEL
ALT: 19 U/L (ref 14–54)
AST: 24 U/L (ref 15–41)
Albumin: 4.2 g/dL (ref 3.5–5.0)
Alkaline Phosphatase: 74 U/L (ref 38–126)
Anion gap: 9 (ref 5–15)
BUN: 16 mg/dL (ref 6–20)
CO2: 26 mmol/L (ref 22–32)
Calcium: 8.9 mg/dL (ref 8.9–10.3)
Chloride: 103 mmol/L (ref 101–111)
Creatinine, Ser: 0.56 mg/dL (ref 0.44–1.00)
GFR calc Af Amer: >60 mL/min (ref >60)
GFR calc non Af Amer: >60 mL/min (ref >60)
Glucose, Bld: 115 mg/dL — ABNORMAL HIGH (ref 65–99)
Potassium: 3.9 mmol/L (ref 3.5–5.1)
Sodium: 138 mmol/L (ref 135–145)
Total Bilirubin: 0.6 mg/dL (ref 0.3–1.2)
Total Protein: 7.3 g/dL (ref 6.5–8.1)

## 2016-11-02 LAB — CBC WITH DIFFERENTIAL/PLATELET
Basophils Absolute: 0.1 K/uL (ref 0–0.1)
Basophils Relative: 1 %
Eosinophils Absolute: 0 K/uL (ref 0–0.7)
Eosinophils Relative: 0 %
HCT: 43.5 % (ref 35.0–47.0)
Hemoglobin: 14.8 g/dL (ref 12.0–16.0)
Lymphocytes Relative: 10 %
Lymphs Abs: 1.6 K/uL (ref 1.0–3.6)
MCH: 28.6 pg (ref 26.0–34.0)
MCHC: 34 g/dL (ref 32.0–36.0)
MCV: 84.2 fL (ref 80.0–100.0)
Monocytes Absolute: 0.6 K/uL (ref 0.2–0.9)
Monocytes Relative: 4 %
Neutro Abs: 13.3 K/uL — ABNORMAL HIGH (ref 1.4–6.5)
Neutrophils Relative %: 85 %
Platelets: 261 K/uL (ref 150–440)
RBC: 5.17 MIL/uL (ref 3.80–5.20)
RDW: 13.1 % (ref 11.5–14.5)
WBC: 15.7 K/uL — ABNORMAL HIGH (ref 3.6–11.0)

## 2016-11-02 MED ORDER — HYDROMORPHONE HCL 1 MG/ML IJ SOLN
1.0000 mg | INTRAMUSCULAR | Status: DC | PRN
  Administered 2016-11-02: 1 mg via INTRAVENOUS
  Administered 2016-11-02 – 2016-11-03 (×5): 2 mg via INTRAVENOUS
  Filled 2016-11-02 (×5): qty 2

## 2016-11-02 MED ORDER — CEFAZOLIN (ANCEF) 1 G IV SOLR: 2.0000 g | INTRAVENOUS | Status: DC

## 2016-11-02 MED ORDER — ONDANSETRON HCL 4 MG/2ML IJ SOLN
4.0000 mg | Freq: Four times a day (QID) | INTRAMUSCULAR | Status: DC | PRN
  Administered 2016-11-02: 4 mg via INTRAVENOUS

## 2016-11-02 MED ORDER — HYDROMORPHONE HCL 1 MG/ML IJ SOLN
INTRAMUSCULAR | Status: AC
  Administered 2016-11-02: 2 mg via INTRAVENOUS
  Filled 2016-11-02: qty 1

## 2016-11-02 MED ORDER — ONDANSETRON HCL 4 MG/2ML IJ SOLN
INTRAMUSCULAR | Status: AC
  Filled 2016-11-02: qty 2

## 2016-11-02 MED ORDER — ONDANSETRON 4 MG PO TBDP
4.0000 mg | ORAL_TABLET | Freq: Four times a day (QID) | ORAL | Status: DC | PRN
  Filled 2016-11-02: qty 1

## 2016-11-02 MED ORDER — DOCUSATE SODIUM 100 MG PO CAPS: 100.0000 mg | ORAL_CAPSULE | Freq: Two times a day (BID) | ORAL | Status: DC

## 2016-11-02 MED ORDER — ACETAMINOPHEN 325 MG PO TABS: 650.0000 mg | ORAL_TABLET | Freq: Four times a day (QID) | ORAL | Status: DC | PRN

## 2016-11-02 MED ORDER — FLEET ENEMA 7-19 GM/118ML RE ENEM: 1.0000 | ENEMA | Freq: Once | RECTAL | Status: DC | PRN

## 2016-11-02 MED ORDER — PANTOPRAZOLE SODIUM 40 MG IV SOLR: 40.0000 mg | Freq: Every day | INTRAVENOUS | Status: DC

## 2016-11-02 MED ORDER — CEFAZOLIN SODIUM-DEXTROSE 2-4 GM/100ML-% IV SOLN: 2.0000 g | INTRAVENOUS | Status: DC

## 2016-11-02 MED ORDER — KETOROLAC TROMETHAMINE 30 MG/ML IJ SOLN
30.0000 mg | Freq: Once | INTRAMUSCULAR | Status: AC
  Administered 2016-11-02: 30 mg via INTRAMUSCULAR
  Filled 2016-11-02: qty 1

## 2016-11-02 MED ORDER — DEXTROSE 5 % IV BOLUS
2.0000 g | INTRAVENOUS | Status: DC
  Filled 2016-11-02: qty 2

## 2016-11-02 MED ORDER — HYDROMORPHONE HCL 1 MG/ML PO LIQD: 1.0000 mg | Freq: Once | ORAL | Status: DC

## 2016-11-02 MED ORDER — BISACODYL 10 MG RE SUPP: 10.0000 mg | Freq: Every day | RECTAL | Status: DC | PRN

## 2016-11-02 MED ORDER — HYDROMORPHONE HCL 1 MG/ML IJ SOLN
INTRAMUSCULAR | Status: AC
  Administered 2016-11-02: 1 mg via INTRAVENOUS
  Filled 2016-11-02: qty 1

## 2016-11-02 MED ORDER — KCL IN DEXTROSE-NACL 20-5-0.9 MEQ/L-%-% IV SOLN
INTRAVENOUS | Status: DC
  Administered 2016-11-03 (×2): via INTRAVENOUS
  Filled 2016-11-02 (×4): qty 1000

## 2016-11-02 MED ORDER — MAGNESIUM HYDROXIDE 400 MG/5ML PO SUSP: 30.0000 mL | Freq: Every day | ORAL | Status: DC | PRN

## 2016-11-02 MED ORDER — HYDROMORPHONE HCL 1 MG/ML IJ SOLN
1.0000 mg | Freq: Once | INTRAMUSCULAR | Status: AC
  Administered 2016-11-02: 1 mg via INTRAVENOUS

## 2016-11-02 MED ORDER — ACETAMINOPHEN 650 MG RE SUPP: 650.0000 mg | Freq: Four times a day (QID) | RECTAL | Status: DC | PRN

## 2016-11-02 MED ORDER — CEFAZOLIN SODIUM-DEXTROSE 2-4 GM/100ML-% IV SOLN
2.0000 g | Freq: Once | INTRAVENOUS | Status: DC
  Filled 2016-11-02: qty 100

## 2016-11-02 NOTE — ED Provider Notes (Signed)
ED ECG REPORT I, Arelia LongestSchaevitz,  Paitynn Mikus M, the attending physician, personally viewed and interpreted this ECG.   Date: 11/02/2016  EKG Time: 2032  Rate: 65  Rhythm: normal sinus rhythm  Axis: Normal  Intervals:none  ST&T Change: No ST segment elevation or depression. T-wave inversions in V2 and V3. No previous for comparison.   Myrna Blazeravid Matthew Jamerion Cabello, MD 11/02/16 2035

## 2016-11-02 NOTE — Progress Notes (Signed)
Pt is alert and oriented. VS stable. Sleeping. Foot pumps and teds applied. Consent signed.

## 2016-11-02 NOTE — H&P (Signed)
Subjective:  Chief complaint:  Right ankle injury.  The patient is a 51 y.o. female who sustained an injury to her right ankle earlier this evening when she apparently stepped into a hole in her yard. She was brought to the emergency room where x-rays demonstrated a fracture dislocation of the right ankle. The patient denies any associated injury. She did not strike her head or lose consciousness. She denies any light-headedness, dizziness, chest pain, or shortness of breath which might have contributed to the injury.  Patient Active Problem List   Diagnosis Date Noted  . SMOKER 06/13/2010   History reviewed. No pertinent past medical history.  History reviewed. No pertinent surgical history.   (Not in a hospital admission) Allergies  Allergen Reactions  . Codeine     Social History  Substance Use Topics  . Smoking status: Never Smoker  . Smokeless tobacco: Never Used  . Alcohol use Not on file    History reviewed. No pertinent family history.   Review of Systems: As noted above. The patient denies any chest pain, shortness of breath, nausea, vomiting, diarrhea, constipation, belly pain, blood in his/her stool, or burning with urination.  Objective: Temp:  [97.7 F (36.5 C)] 97.7 F (36.5 C) (03/04 1731) Pulse Rate:  [78] 78 (03/04 1731) Resp:  [22] 22 (03/04 1731) BP: (121)/(79) 121/79 (03/04 1731) SpO2:  [98 %] 98 % (03/04 1731) Weight:  [86.2 kg (190 lb)] 86.2 kg (190 lb) (03/04 1726)  Physical Exam: General:  Alert, no acute distress Psychiatric:  Patient is competent for consent with normal mood and affect  Cardiovascular:  RRR  Respiratory:  Clear to auscultation. No wheezing. Non-labored breathing GI:  Abdomen is soft and non-tender Skin:  No lesions in the area of chief complaint Neurologic:  Sensation intact distally Lymphatic:  No axillary or cervical lymphadenopathy  Orthopedic Exam:  Orthopedic examination is limited to the right foot and lower leg. There  is an obvious deformity of the ankle with moderate swelling noted. There are no skin violations or other skin abnormalities noted. The ankle was reduced and placed into a posterior splint with sugar tong supplement. Following reduction, the patient is able to dorsiflex and plantarflex her toes. Sensation was intact to light touch in all digits. She has good capillary refill to all digits.  Imaging Review: Recent x-rays of the right ankle are available for review.  These films demonstrate a displaced fracture dislocation of the right ankle with a long posterior oblique fracture of the distal fibula and a posterior malleolar fragment. The medial malleolus appears to be intact.Marland Kitchen.   Postreduction films demonstrate near anatomic alignment of the fractures in both the AP and lateral projections. The posterior malleolar fragment appears to involve approximately 25-30% of the posterior portion of the plafond.  Assessment: Closed bimalleolar fracture dislocation right ankle.   Plan: The treatment options have been discussed in detail with the patient and her family. given the severity of the injury, I feel that the patient be best managed by a formal open reduction and internal fixation of the fracture fragments. This procedure has been discussed in detail with the patient. The risks (including bleeding, infection, nerve and/or blood vessel injury, persistent or recurrent pain, loosening or failure of the components, leg length inequality, dislocation, need for further surgery, blood clots, strokes, heart attacks or arrhythmias, pneumonia, etc.) and benefits of this procedure also have been discussed. The patient states her understanding and agrees to proceed. A formal written consent will be  obtained by the nursing staff. She will be admitted at this time in preparation for this procedure to be done tomorrow.

## 2016-11-02 NOTE — ED Provider Notes (Signed)
[facility] Emergency Department Provider Note  ____________________________________________  Time seen: Approximately 5:53 PM  I have reviewed the triage vital signs and the nursing notes.   HISTORY  Chief Complaint Ankle Pain    HPI Margaret [name] is a 51 y.o. female presenting to the emergency department with 10 out of 10 right ankle pain. Patient states that she "fell in a hole" and sustained an inversion ankle injury, right. She states that she heard an audible "pop". Patient denies hitting her head during the incident. Patient denies prior traumas or surgeries to the right lower extremity. She has attempted no alleviating measures. Patient denies chest pain, chest tightness, shortness of breath, abdominal pain, nausea and vomiting.    History reviewed. No pertinent past medical history.  Patient Active Problem List   Diagnosis Date Noted  . Fracture dislocation of right ankle 11/02/2016  . SMOKER 06/13/2010    History reviewed. No pertinent surgical history.  Prior to Admission medications   Not on File    Allergies Tramadol; Codeine; and Oxycodone-acetaminophen  History reviewed. No pertinent family history.  Social History Social History  Substance Use Topics  . Smoking status: Never Smoker  . Smokeless tobacco: Never Used  . Alcohol use Not on file     Review of Systems  Constitutional: No fever/chills Eyes: No visual changes. No discharge ENT: No upper respiratory complaints. Cardiovascular: no chest pain. Respiratory: no cough. No SOB. Gastrointestinal: No abdominal pain.  No nausea, no vomiting.  No diarrhea.  No constipation. Musculoskeletal: Patient has right ankle pain.  Skin: Negative for rash, abrasions, lacerations, ecchymosis. Neurological: Negative for headaches, focal weakness or numbness. ____________________________________________   PHYSICAL EXAM:  VITAL SIGNS: ED Triage Vitals  Enc Vitals Group     BP  11/02/16 1731 121/79     Pulse Rate 11/02/16 1731 78     Resp 11/02/16 1731 (!) 22     Temp 11/02/16 1731 97.7 F (36.5 C)     Temp Source 11/02/16 1731 Oral     SpO2 11/02/16 1731 98 %     Weight 11/02/16 1726 190 lb (86.2 kg)     Height 11/02/16 1726 5\' 4"  (1.626 m)     Head Circumference --      Peak Flow --      Pain Score 11/02/16 1726 10     Pain Loc --      Pain Edu? --      Excl. in GC? --      Constitutional: Alert and oriented. Well appearing and in no acute distress. Cardiovascular: Normal rate, regular rhythm. Normal S1 and S2.  Good peripheral circulation. Respiratory: Normal respiratory effort without tachypnea or retractions. Lungs CTAB. Good air entry to the bases with no decreased or absent breath sounds. Musculoskeletal: Patient has 5 out of 5 strength in the lower extremities bilaterally. To inspection, right ankle appears edematous and in malalignment on initial physical exam. Patient is able to move all 5 right toes. Patient is able to perform limited dorsiflexion and plantar flexion at the right ankle, likely secondary to pain. Patient has pain elicited with palpation of the anterior talofibular ligament and the lateral malleolus, right. Patient also has mild tenderness elicited over the fibular head, right. Palpable dorsalis pedis pulse, right. Neurologic:  Normal speech and language. No gross focal neurologic deficits are appreciated. Reflexes are 2+ and symmetric in the lower extremities bilaterally. Skin:  Skin is warm, dry and intact. No rash noted. Psychiatric: Mood  and affect are normal. Speech and behavior are normal. Patient exhibits appropriate insight and judgement. ____________________________________________   LABS (all labs ordered are listed, but only abnormal results are displayed)  Labs Reviewed  CBC WITH DIFFERENTIAL/PLATELET - Abnormal; Notable for the following:       Result Value   WBC 15.7 (*)    Neutro Abs 13.3 (*)    All other  components within normal limits  COMPREHENSIVE METABOLIC PANEL - Abnormal; Notable for the following:    Glucose, Bld 115 (*)    All other components within normal limits   ____________________________________________  EKG   ____________________________________________  RADIOLOGY Geraldo PitterI, Jaclyn M Woods, personally viewed and evaluated these images (plain radiographs) as part of my medical decision making, as well as reviewing the written report by the radiologist.    Dg Tibia/fibula Right  Result Date: 11/02/2016 CLINICAL DATA:  Initial encounter for Pt states she stepped in a whole today and her RT foot "turned"; pt with c/o RT lower tib/fib pain and deformity; pt's RT ankle naturally in lateral position while RT knee is in AP position at time of x-ray EXAM: RIGHT TIBIA AND FIBULA - 2 VIEW COMPARISON:  None. FINDINGS: This bimalleolar comminuted fracture dislocation involving the ankle with posterior and lateral displacement of the talus relative to the tibia. Talar dome intact. More proximal tibia and fibula within normal limits. IMPRESSION: Trimalleolar fracture dislocation about the ankle. At minimum, this warrants dedicated ankle radiographs. Alternatively, CT should be considered. Electronically Signed   By: Jeronimo GreavesKyle  Talbot M.D.   On: 11/02/2016 18:33   Dg Ankle Complete Left  Result Date: 11/02/2016 CLINICAL DATA:  Post reduction of trimalleolar fracture of the right ankle. EXAM: RIGHT ANKLE COMPLETE - 3+ VIEW COMPARISON:  Same day pre reduction images. FINDINGS: The ankle mortise is now near anatomic after reduction. Comminuted oblique fracture of the distal fibula is identified with slight medial and dorsal displacement of the distal fracture fragments by 1/4 shaft width. 4 mm of posterior displacement and 3 mm of depression involving the posterior malleolar fracture fragment. Medial malleolar fracture is not well visualized. There is slight widening of the medial ankle mortise up to 5 mm. New  fiberglass cast projects over the ankle. IMPRESSION: Status post reduction of trimalleolar fracture - dislocation of right ankle with improved alignment demonstrated as above. Electronically Signed   By: Tollie Ethavid  Kwon M.D.   On: 11/02/2016 19:43   Dg Ankle Complete Right  Result Date: 11/02/2016 CLINICAL DATA:  Post reduction of trimalleolar fracture of the right ankle. EXAM: RIGHT ANKLE - COMPLETE 3+ VIEW COMPARISON:  Same day pre reduction images. FINDINGS: The ankle mortise is now near anatomic after reduction. Comminuted oblique fracture of the distal fibula is identified with slight medial and dorsal displacement of the distal fracture fragments by 1/4 shaft width. 4 mm of posterior displacement and 3 mm of depression involving the posterior malleolar fracture fragment. Medial malleolar fracture is not well visualized. There is slight widening of the medial ankle mortise up to 5 mm. New fiberglass cast projects over the ankle. IMPRESSION: Status post reduction of trimalleolar fracture - dislocation of right ankle with improved alignment demonstrated as above. Electronically Signed   By: Tollie Ethavid  Kwon M.D.   On: 11/02/2016 20:23    ____________________________________________    PROCEDURES  Procedure(s) performed:    Procedures  Patient's trimalleolar fracture was reduced by Cranston Neighborhris Gaines PA-C   SPLINT APPLICATION Date/Time: 10:27 PM Authorized by: Orvil FeilJaclyn M Woods Consent: Verbal  consent obtained. Risks and benefits: risks, benefits and alternatives were discussed Consent given by: patient Location details: Right ankle  Splint type: Stirrup  Post-procedure: The splinted body part was neurovascularly unchanged following the procedure. Patient tolerance: Patient tolerated the procedure well with no immediate complications.   Medications  HYDROmorphone (DILAUDID) injection 1-2 mg (1 mg Intravenous Given 11/02/16 2103)  ondansetron (ZOFRAN-ODT) disintegrating tablet 4 mg ( Oral See  Alternative 11/02/16 2210)    Or  ondansetron (ZOFRAN) injection 4 mg (4 mg Intravenous Given 11/02/16 2210)  dextrose 5 % 50 mL with ceFAZolin (ANCEF) 2 g (not administered)  ketorolac (TORADOL) 30 MG/ML injection 30 mg (30 mg Intramuscular Given 11/02/16 1801)  HYDROmorphone (DILAUDID) injection 1 mg (1 mg Intravenous Given 11/02/16 1851)     ____________________________________________   INITIAL IMPRESSION / ASSESSMENT AND PLAN / ED COURSE  Pertinent labs & imaging results that were available during my care of the patient were reviewed by me and considered in my medical decision making (see chart for details).  Review of the [place] CSRS was performed in accordance of the [facility] prior to dispensing any controlled drugs.     Assessment and Plan: Right Ankle Pain  Patient presents to the emergency department with right ankle pain after an inversion right ankle trauma sustained earlier today. DG right ankle and DG right tibia-fibula indicates a trimalleolar fracture, right. Toradol and Dilaudid were given in the emergency department. Cranston Neighbor PA-C, performed right trimalleolar fracture reduction after Dr. Joice Lofts, the orthopedist on-call, was consulted. Patient was neurovascularly intact status post reduction. Dr. Joice Lofts personally evaluated the patient. Patient's care was transferred to Dr. Joice Lofts for further care and management. All patient questions were answered.  ____________________________________________  FINAL CLINICAL IMPRESSION(S) / ED DIAGNOSES  Final diagnoses:  Closed trimalleolar fracture of right ankle, initial encounter      NEW MEDICATIONS STARTED DURING THIS VISIT:  New Prescriptions   No medications on file        This chart was dictated using voice recognition software/Dragon. Despite best efforts to proofread, errors can occur which can change the meaning. Any change was purely unintentional.    Orvil Feil, PA-C 11/02/16 2228    Phineas Semen,  MD 11/02/16 2237

## 2016-11-02 NOTE — ED Triage Notes (Signed)
Pt reports she stepped in a hole and foot turned, pt has ankle stabilized from EMS brace.

## 2016-11-02 NOTE — ED Notes (Addendum)
Pt has rt ankle injury from stepping in a hole today.

## 2016-11-03 ENCOUNTER — Encounter
Admission: EM | Disposition: A | Discharge status: Home / Self Care | Payer: Self-pay | Source: Home / Self Care | Attending: Surgery

## 2016-11-03 ENCOUNTER — Inpatient Hospital Stay: Payer: 59 | Admitting: Anesthesiology

## 2016-11-03 ENCOUNTER — Inpatient Hospital Stay: Payer: 59

## 2016-11-03 HISTORY — PX: ORIF ANKLE FRACTURE: SHX5408

## 2016-11-03 LAB — SURGICAL PCR SCREEN
MRSA, PCR: NEGATIVE
STAPHYLOCOCCUS AUREUS: NEGATIVE

## 2016-11-03 SURGERY — OPEN REDUCTION INTERNAL FIXATION (ORIF) ANKLE FRACTURE
Anesthesia: General | Site: Ankle | Laterality: Left | Wound class: Clean

## 2016-11-03 MED ORDER — ACETAMINOPHEN 10 MG/ML IV SOLN
INTRAVENOUS | Status: AC
  Filled 2016-11-03: qty 100

## 2016-11-03 MED ORDER — NEOMYCIN-POLYMYXIN B GU 40-200000 IR SOLN
Status: DC | PRN
  Administered 2016-11-03: 240 mL

## 2016-11-03 MED ORDER — ONDANSETRON HCL 4 MG PO TABS: 4.0000 mg | ORAL_TABLET | Freq: Four times a day (QID) | ORAL | Status: DC | PRN

## 2016-11-03 MED ORDER — BUPIVACAINE HCL 0.5 % IJ SOLN
INTRAMUSCULAR | Status: DC | PRN
  Administered 2016-11-03: 15 mL

## 2016-11-03 MED ORDER — ACETAMINOPHEN 325 MG PO TABS: 650.0000 mg | ORAL_TABLET | Freq: Four times a day (QID) | ORAL | Status: DC | PRN

## 2016-11-03 MED ORDER — ONDANSETRON HCL 4 MG/2ML IJ SOLN
4.0000 mg | INTRAMUSCULAR | Status: DC | PRN
  Administered 2016-11-03: 4 mg via INTRAVENOUS
  Administered 2016-11-03: 8 mg via INTRAVENOUS
  Filled 2016-11-03 (×2): qty 4

## 2016-11-03 MED ORDER — KCL IN DEXTROSE-NACL 20-5-0.9 MEQ/L-%-% IV SOLN
INTRAVENOUS | Status: DC
  Administered 2016-11-03: 20:00:00 via INTRAVENOUS
  Filled 2016-11-03 (×4): qty 1000

## 2016-11-03 MED ORDER — CEFAZOLIN SODIUM 1 G IJ SOLR
INTRAMUSCULAR | Status: DC | PRN
  Administered 2016-11-03: 2 g

## 2016-11-03 MED ORDER — KETOROLAC TROMETHAMINE 15 MG/ML IJ SOLN
30.0000 mg | Freq: Once | INTRAMUSCULAR | Status: AC
  Administered 2016-11-03: 30 mg via INTRAVENOUS
  Filled 2016-11-03: qty 2

## 2016-11-03 MED ORDER — MIDAZOLAM HCL 2 MG/2ML IJ SOLN
INTRAMUSCULAR | Status: DC | PRN
  Administered 2016-11-03: 2 mg via INTRAVENOUS

## 2016-11-03 MED ORDER — ACETAMINOPHEN 500 MG PO TABS
1000.0000 mg | ORAL_TABLET | Freq: Four times a day (QID) | ORAL | Status: DC
  Administered 2016-11-03 – 2016-11-04 (×3): 1000 mg via ORAL
  Filled 2016-11-03 (×3): qty 2

## 2016-11-03 MED ORDER — KETOROLAC TROMETHAMINE 30 MG/ML IJ SOLN
30.0000 mg | Freq: Once | INTRAMUSCULAR | Status: AC
  Administered 2016-11-03: 30 mg via INTRAVENOUS
  Filled 2016-11-03: qty 1

## 2016-11-03 MED ORDER — MIDAZOLAM HCL 2 MG/2ML IJ SOLN
INTRAMUSCULAR | Status: AC
  Filled 2016-11-03: qty 2

## 2016-11-03 MED ORDER — BISACODYL 10 MG RE SUPP: 10.0000 mg | Freq: Every day | RECTAL | Status: DC | PRN

## 2016-11-03 MED ORDER — GLYCOPYRROLATE 0.2 MG/ML IJ SOLN
INTRAMUSCULAR | Status: AC
  Filled 2016-11-03: qty 1

## 2016-11-03 MED ORDER — FENTANYL CITRATE (PF) 100 MCG/2ML IJ SOLN: 25.0000 ug | INTRAMUSCULAR | Status: DC | PRN

## 2016-11-03 MED ORDER — CEFAZOLIN SODIUM 1 G IJ SOLR
INTRAMUSCULAR | Status: AC
  Filled 2016-11-03: qty 20

## 2016-11-03 MED ORDER — LIDOCAINE HCL (CARDIAC) 20 MG/ML IV SOLN
INTRAVENOUS | Status: DC | PRN
  Administered 2016-11-03: 10 mg via INTRAVENOUS

## 2016-11-03 MED ORDER — DIPHENHYDRAMINE HCL 12.5 MG/5ML PO ELIX: 12.5000 mg | ORAL_SOLUTION | ORAL | Status: DC | PRN

## 2016-11-03 MED ORDER — ONDANSETRON HCL 4 MG/2ML IJ SOLN: 4.0000 mg | Freq: Four times a day (QID) | INTRAMUSCULAR | Status: DC | PRN

## 2016-11-03 MED ORDER — DEXAMETHASONE SODIUM PHOSPHATE 10 MG/ML IJ SOLN
INTRAMUSCULAR | Status: DC | PRN
  Administered 2016-11-03: 10 mg via INTRAVENOUS

## 2016-11-03 MED ORDER — ONDANSETRON HCL 4 MG/2ML IJ SOLN
INTRAMUSCULAR | Status: DC | PRN
  Administered 2016-11-03: 4 mg via INTRAVENOUS

## 2016-11-03 MED ORDER — ONDANSETRON HCL 4 MG/2ML IJ SOLN: 4.0000 mg | Freq: Once | INTRAMUSCULAR | Status: DC | PRN

## 2016-11-03 MED ORDER — CEFAZOLIN SODIUM-DEXTROSE 2-4 GM/100ML-% IV SOLN: 2.0000 g | Freq: Four times a day (QID) | INTRAVENOUS | Status: DC

## 2016-11-03 MED ORDER — ENOXAPARIN SODIUM 40 MG/0.4ML ~~LOC~~ SOLN
40.0000 mg | SUBCUTANEOUS | Status: DC
  Administered 2016-11-04: 40 mg via SUBCUTANEOUS
  Filled 2016-11-03: qty 0.4

## 2016-11-03 MED ORDER — METOCLOPRAMIDE HCL 5 MG/ML IJ SOLN: 5.0000 mg | Freq: Three times a day (TID) | INTRAMUSCULAR | Status: DC | PRN

## 2016-11-03 MED ORDER — HYDROMORPHONE HCL 2 MG PO TABS
2.0000 mg | ORAL_TABLET | ORAL | Status: DC | PRN
  Administered 2016-11-04: 4 mg via ORAL
  Filled 2016-11-03 (×2): qty 2

## 2016-11-03 MED ORDER — FENTANYL CITRATE (PF) 100 MCG/2ML IJ SOLN
INTRAMUSCULAR | Status: AC
  Filled 2016-11-03: qty 2

## 2016-11-03 MED ORDER — ONDANSETRON 4 MG PO TBDP
4.0000 mg | ORAL_TABLET | Freq: Four times a day (QID) | ORAL | Status: DC | PRN
  Filled 2016-11-03: qty 1

## 2016-11-03 MED ORDER — PROPOFOL 10 MG/ML IV BOLUS
INTRAVENOUS | Status: DC | PRN
  Administered 2016-11-03: 150 mg via INTRAVENOUS

## 2016-11-03 MED ORDER — LIDOCAINE HCL (PF) 2 % IJ SOLN
INTRAMUSCULAR | Status: AC
  Filled 2016-11-03: qty 2

## 2016-11-03 MED ORDER — FLEET ENEMA 7-19 GM/118ML RE ENEM: 1.0000 | ENEMA | Freq: Once | RECTAL | Status: DC | PRN

## 2016-11-03 MED ORDER — PANTOPRAZOLE SODIUM 40 MG PO TBEC
40.0000 mg | DELAYED_RELEASE_TABLET | Freq: Every day | ORAL | Status: DC
  Administered 2016-11-03 – 2016-11-04 (×2): 40 mg via ORAL
  Filled 2016-11-03 (×2): qty 1

## 2016-11-03 MED ORDER — CEFAZOLIN SODIUM-DEXTROSE 2-3 GM-% IV SOLR
2.0000 g | Freq: Four times a day (QID) | INTRAVENOUS | Status: AC
  Administered 2016-11-03 – 2016-11-04 (×3): 2 g via INTRAVENOUS
  Filled 2016-11-03 (×3): qty 50

## 2016-11-03 MED ORDER — MAGNESIUM HYDROXIDE 400 MG/5ML PO SUSP: 30.0000 mL | Freq: Every day | ORAL | Status: DC | PRN

## 2016-11-03 MED ORDER — ONDANSETRON HCL 4 MG/2ML IJ SOLN
INTRAMUSCULAR | Status: AC
  Filled 2016-11-03: qty 2

## 2016-11-03 MED ORDER — PROPOFOL 10 MG/ML IV BOLUS
INTRAVENOUS | Status: AC
  Filled 2016-11-03: qty 20

## 2016-11-03 MED ORDER — ACETAMINOPHEN 650 MG RE SUPP: 650.0000 mg | Freq: Four times a day (QID) | RECTAL | Status: DC | PRN

## 2016-11-03 MED ORDER — ACETAMINOPHEN 10 MG/ML IV SOLN
INTRAVENOUS | Status: DC | PRN
  Administered 2016-11-03: 1000 mg via INTRAVENOUS

## 2016-11-03 MED ORDER — HYDROMORPHONE HCL 1 MG/ML IJ SOLN
1.0000 mg | INTRAMUSCULAR | Status: DC | PRN
  Administered 2016-11-03: 2 mg via INTRAVENOUS
  Filled 2016-11-03: qty 2

## 2016-11-03 MED ORDER — GLYCOPYRROLATE 0.2 MG/ML IJ SOLN
INTRAMUSCULAR | Status: DC | PRN
  Administered 2016-11-03: 0.2 mg via INTRAVENOUS

## 2016-11-03 MED ORDER — LACTATED RINGERS IV SOLN
INTRAVENOUS | Status: DC | PRN
  Administered 2016-11-03: 17:00:00 via INTRAVENOUS

## 2016-11-03 MED ORDER — DOCUSATE SODIUM 100 MG PO CAPS
100.0000 mg | ORAL_CAPSULE | Freq: Two times a day (BID) | ORAL | Status: DC
  Administered 2016-11-03 – 2016-11-04 (×2): 100 mg via ORAL
  Filled 2016-11-03 (×2): qty 1

## 2016-11-03 MED ORDER — KETOROLAC TROMETHAMINE 15 MG/ML IJ SOLN
15.0000 mg | Freq: Four times a day (QID) | INTRAMUSCULAR | Status: DC
  Administered 2016-11-03 – 2016-11-04 (×3): 15 mg via INTRAVENOUS
  Filled 2016-11-03 (×3): qty 1

## 2016-11-03 MED ORDER — METOCLOPRAMIDE HCL 10 MG PO TABS: 5.0000 mg | ORAL_TABLET | Freq: Three times a day (TID) | ORAL | Status: DC | PRN

## 2016-11-03 MED ORDER — FENTANYL CITRATE (PF) 100 MCG/2ML IJ SOLN
INTRAMUSCULAR | Status: DC | PRN
  Administered 2016-11-03 (×2): 50 ug via INTRAVENOUS

## 2016-11-03 MED ORDER — DEXAMETHASONE SODIUM PHOSPHATE 10 MG/ML IJ SOLN
INTRAMUSCULAR | Status: AC
  Filled 2016-11-03: qty 1

## 2016-11-03 SURGICAL SUPPLY — 51 items
BANDAGE ACE 4X5 VEL STRL LF (GAUZE/BANDAGES/DRESSINGS) ×6 IMPLANT
BANDAGE ACE 6X5 VEL STRL LF (GAUZE/BANDAGES/DRESSINGS) ×3 IMPLANT
BIT DRILL 2.5X2.75 QC CALB (BIT) ×3 IMPLANT
BIT DRILL 2.9 CANN QC NONSTRL (BIT) IMPLANT
BIT DRILL 3.5X5.5 QC CALB (BIT) ×3 IMPLANT
BLADE SURG SZ10 CARB STEEL (BLADE) ×6 IMPLANT
BNDG COHESIVE 4X5 TAN STRL (GAUZE/BANDAGES/DRESSINGS) ×3 IMPLANT
BNDG ESMARK 6X12 TAN STRL LF (GAUZE/BANDAGES/DRESSINGS) ×3 IMPLANT
BNDG PLASTER FAST 4X5 WHT LF (CAST SUPPLIES) ×12 IMPLANT
CANISTER SUCT 1200ML W/VALVE (MISCELLANEOUS) ×3 IMPLANT
CHLORAPREP W/TINT 26ML (MISCELLANEOUS) ×6 IMPLANT
COVER LIGHT HANDLE STERIS (MISCELLANEOUS) ×3 IMPLANT
CUFF TOURN 18 STER (MISCELLANEOUS) ×3 IMPLANT
CUFF TOURN 24 STER (MISCELLANEOUS) IMPLANT
CUFF TOURN 30 STER DUAL PORT (MISCELLANEOUS) IMPLANT
DRAPE C-ARM XRAY 36X54 (DRAPES) ×3 IMPLANT
DRAPE C-ARMOR (DRAPES) ×3 IMPLANT
DRAPE INCISE IOBAN 66X45 STRL (DRAPES) ×3 IMPLANT
DRAPE U-SHAPE 47X51 STRL (DRAPES) ×3 IMPLANT
ELECT CAUTERY BLADE 6.4 (BLADE) ×3 IMPLANT
ELECT REM PT RETURN 9FT ADLT (ELECTROSURGICAL) ×3
ELECTRODE REM PT RTRN 9FT ADLT (ELECTROSURGICAL) ×1 IMPLANT
GAUZE PETRO XEROFOAM 1X8 (MISCELLANEOUS) ×3 IMPLANT
GAUZE SPONGE 4X4 12PLY STRL (GAUZE/BANDAGES/DRESSINGS) ×3 IMPLANT
GLOVE BIO SURGEON STRL SZ8 (GLOVE) ×6 IMPLANT
GLOVE INDICATOR 8.0 STRL GRN (GLOVE) ×3 IMPLANT
GOWN STRL REUS W/ TWL LRG LVL3 (GOWN DISPOSABLE) ×1 IMPLANT
GOWN STRL REUS W/ TWL XL LVL3 (GOWN DISPOSABLE) ×1 IMPLANT
GOWN STRL REUS W/TWL LRG LVL3 (GOWN DISPOSABLE) ×2
GOWN STRL REUS W/TWL XL LVL3 (GOWN DISPOSABLE) ×2
HEMOVAC 400ML (MISCELLANEOUS)
KIT DRAIN HEMOVAC JP 7FR 400ML (MISCELLANEOUS) IMPLANT
KIT RM TURNOVER STRD PROC AR (KITS) ×3 IMPLANT
LABEL OR SOLS (LABEL) ×3 IMPLANT
NS IRRIG 1000ML POUR BTL (IV SOLUTION) ×3 IMPLANT
PACK EXTREMITY ARMC (MISCELLANEOUS) ×3 IMPLANT
PAD ABD DERMACEA PRESS 5X9 (GAUZE/BANDAGES/DRESSINGS) ×6 IMPLANT
PAD CAST CTTN 4X4 STRL (SOFTGOODS) ×2 IMPLANT
PAD PREP 24X41 OB/GYN DISP (PERSONAL CARE ITEMS) ×3 IMPLANT
PADDING CAST COTTON 4X4 STRL (SOFTGOODS) ×4
SCREW CORTICAL 3.5MM  20MM (Screw) ×6 IMPLANT
SCREW CORTICAL 3.5MM 20MM (Screw) ×3 IMPLANT
SPONGE LAP 18X18 5 PK (GAUZE/BANDAGES/DRESSINGS) ×3 IMPLANT
STAPLER SKIN PROX 35W (STAPLE) ×3 IMPLANT
STOCKINETTE IMPERV 14X48 (MISCELLANEOUS) ×3 IMPLANT
SUT VIC AB 0 CT1 36 (SUTURE) ×3 IMPLANT
SUT VIC AB 2-0 SH 27 (SUTURE) ×4
SUT VIC AB 2-0 SH 27XBRD (SUTURE) ×2 IMPLANT
SUT VIC AB 3-0 SH 27 (SUTURE) ×2
SUT VIC AB 3-0 SH 27X BRD (SUTURE) ×1 IMPLANT
SYRINGE 10CC LL (SYRINGE) ×3 IMPLANT

## 2016-11-03 NOTE — Transfer of Care (Signed)
Immediate Anesthesia Transfer of Care Note  Patient: Margaret [name]  Procedure(s) Performed: Procedure(s): OPEN REDUCTION INTERNAL FIXATION (ORIF) ANKLE FRACTURE (Left)  Patient Location: PACU  Anesthesia Type:General  Level of Consciousness: awake  Airway & Oxygen Therapy: Patient connected to face mask oxygen  Post-op Assessment: Post -op Vital signs reviewed and stable  Post vital signs: stable  Last Vitals:  Vitals:   11/03/16 1100 11/03/16 1822  BP:  (!) 130/59  Pulse:  99  Resp:  (!) 21  Temp: 36.8 C 36.2 C    Last Pain:  Vitals:   11/03/16 1822  TempSrc: Temporal  PainSc:       Patients Stated Pain Goal: 5 (11/03/16 [id number])  Complications: No apparent anesthesia complications

## 2016-11-03 NOTE — Anesthesia Post-op Follow-up Note (Cosign Needed)
Anesthesia QCDR form completed.        

## 2016-11-03 NOTE — Op Note (Signed)
11/02/2016 - 11/03/2016  6:19 PM  Patient:   Margaret [name]  Pre-Op Diagnosis:   Closed unstable bimalleolar fracture dislocation, right ankle.  Post-Op Diagnosis:   Same.  Procedure:   Open reduction and internal fixation of unstable distal fibular fracture, right ankle.  Surgeon:   Maryagnes AmosJ. Jeffrey Alexina Niccoli, MD  Assistant:   None  Anesthesia:   General LMA  Findings:   As above.  Complications:   None  EBL:   10 cc  Fluids:   400 cc crystalloid  UOP:   None  TT:   46 min at 250 mmHg  Drains:   None  Closure:   Staples  Implants:   Biomet 3.5 mm cortical screws x3  Brief Clinical Note:   The patient is a 51 year old female who sustained the above-noted injury last evening when she stepped into a hole in her yard. She was brought to the emergency room where the right ankle fracture dislocation was reduced and temporary stabilized in a splint. She was admitted for pain control and presents at this time for definitive management of her injury.  Procedure:   The patient was brought into the operating room and lain in the supine position. After adequate general laryngeal mask anesthesia was obtained, the patient's right foot and lower leg were prepped with ChloraPrep solution before being draped sterilely. Preoperative antibiotics were administered. A timeout was performed to verify the appropriate surgical site before the limb was exsanguinated with an Esmarch and the calf tourniquet inflated to 250 mmHg. Laterally, a 5-6 cm incision was made over the lateral aspect of the distal fibula. The incision was carried down through the subcutaneous tissues to expose the fracture site. The fracture hematoma was debrided before the fracture was reduced and temporarily secured using a bone clamp. Given that the obliquity to the fracture was quite long, it was felt that the fracture could be stabilized using three anterior to posteriorly placed cortical screws in lag fashion. Therefore, three lag screws  were placed in an anterior to posterior direction perpendicular to the fracture. The adequacy of fracture reduction and hardware position was verified fluoroscopically in AP and lateral projections and found to be excellent.the mortise was reduced anatomically. In addition, on the lateral view, the posterior malleolar fragment was anatomically reduced. Furthermore, it appeared to involve less than 20% of the articular surface. Therefore, it was felt that formal stabilization of this fragment was not indicated.  The wound was copiously irrigated with sterile saline solution before the subcutaneous tissues were closed in two layers using 2-0 and 3-0 Vicryl interrupted sutures before the skin was closed using staples. A total of 15 cc of 0.5% plain Sensorcaine was injected in and around the incision sites to help with postoperative analgesia. Sterile bulky dressings were applied to the wounds before the patient was placed into a posterior splint with a sugar tong supplement, maintaining the ankle in neutral dorsiflexion. The patient was then awakened, extubated, and returned to the recovery room in satisfactory condition after tolerating the procedure well.

## 2016-11-03 NOTE — Anesthesia Procedure Notes (Signed)
Procedure Name: LMA Insertion Date/Time: 11/03/2016 4:55 PM Performed by: Irving BurtonBACHICH, Tayveon Lombardo Pre-anesthesia Checklist: Patient identified, Emergency Drugs available, Suction available and Patient being monitored Patient Re-evaluated:Patient Re-evaluated prior to inductionOxygen Delivery Method: Circle system utilized Preoxygenation: Pre-oxygenation with 100% oxygen Intubation Type: IV induction Ventilation: Mask ventilation without difficulty LMA: LMA inserted LMA Size: 5.0 Number of attempts: 1 Placement Confirmation: breath sounds checked- equal and bilateral and positive ETCO2 Tube secured with: Tape Dental Injury: Teeth and Oropharynx as per pre-operative assessment

## 2016-11-03 NOTE — Anesthesia Preprocedure Evaluation (Addendum)
Anesthesia Evaluation  Patient identified by MRN, date of birth, ID band Patient awake    Reviewed: Allergy & Precautions, NPO status , Patient's Chart, lab work & pertinent test results, reviewed documented beta blocker date and time   Airway Mallampati: III  TM Distance: >3 FB     Dental  (+) Chipped   Pulmonary           Cardiovascular      Neuro/Psych PSYCHIATRIC DISORDERS    GI/Hepatic   Endo/Other    Renal/GU      Musculoskeletal   Abdominal   Peds  Hematology   Anesthesia Other Findings History reviewed. No pertinent past medical history.  Past Surgical History: 2008: HYSTEROTOMY  BMI    Body Mass Index:  37.35 kg/m Mentioned spinal, but pt would rather be put to sleep. No significant regurg  Etc.     Reproductive/Obstetrics                           Anesthesia Physical Anesthesia Plan  ASA: III  Anesthesia Plan: General LMA   Post-op Pain Management:    Induction: Intravenous  Airway Management Planned: LMA  Additional Equipment:   Intra-op Plan:   Post-operative Plan:   Informed Consent: I have reviewed the patients History and Physical, chart, labs and discussed the procedure including the risks, benefits and alternatives for the proposed anesthesia with the patient or authorized representative who has indicated his/her understanding and acceptance.     Plan Discussed with: CRNA  Anesthesia Plan Comments:        Anesthesia Quick Evaluation

## 2016-11-03 NOTE — Progress Notes (Signed)
Verbal order by MD Poggi  Patient can have ice chips until 1300 Order placed

## 2016-11-04 ENCOUNTER — Encounter: Payer: Self-pay | Admitting: Surgery

## 2016-11-04 LAB — BASIC METABOLIC PANEL
Anion gap: 8 (ref 5–15)
BUN: 12 mg/dL (ref 6–20)
CO2: 27 mmol/L (ref 22–32)
Calcium: 8.1 mg/dL — ABNORMAL LOW (ref 8.9–10.3)
Chloride: 102 mmol/L (ref 101–111)
Creatinine, Ser: 0.66 mg/dL (ref 0.44–1.00)
GFR calc Af Amer: >60 mL/min (ref >60)
GFR calc non Af Amer: >60 mL/min (ref >60)
Glucose, Bld: 132 mg/dL — ABNORMAL HIGH (ref 65–99)
Potassium: 4.3 mmol/L (ref 3.5–5.1)
Sodium: 137 mmol/L (ref 135–145)

## 2016-11-04 MED ORDER — HYDROMORPHONE HCL 2 MG PO TABS: 2.0000 mg | ORAL_TABLET | ORAL | 0 refills | Status: DC | PRN

## 2016-11-04 MED ORDER — ENOXAPARIN SODIUM 40 MG/0.4ML ~~LOC~~ SOLN: 40.0000 mg | SUBCUTANEOUS | 0 refills | Status: DC

## 2016-11-04 NOTE — Progress Notes (Signed)
   Subjective: 1 Day Post-Op Procedure(s) (LRB): OPEN REDUCTION INTERNAL FIXATION (ORIF) ANKLE FRACTURE (Left) Patient reports pain as 4 on 0-10 scale.   Patient is well, and has had no acute complaints or problems We will start therapy today.  Plan is to go Home after hospital stay. no nausea and no vomiting Patient denies any chest pains or shortness of breath. Objective: Vital signs in last 24 hours: Temp:  [97.1 F (36.2 C)-98.8 F (37.1 C)] 98.2 F (36.8 C) (03/06 0741) Pulse Rate:  [55-99] 78 (03/06 0741) Resp:  [8-21] 18 (03/06 0741) BP: (95-130)/(38-76) 98/42 (03/06 0741) SpO2:  [94 %-100 %] 100 % (03/06 0741) FiO2 (%):  [28 %] 28 % (03/05 1922) Weight:  [98.4 kg (217 lb)] 98.4 kg (217 lb) (03/05 1514) Heels are non tender and elevated off the bed using rolled towels  Intake/Output from previous day: 03/05 0701 - 03/06 0700 In: 695.3 [I.V.:595.3; IV Piggyback:100] Out: 10 [Blood:10] Intake/Output this shift: No intake/output data recorded.   Recent Labs  11/02/16 2023  HGB 14.8    Recent Labs  11/02/16 2023  WBC 15.7*  RBC 5.17  HCT 43.5  PLT 261    Recent Labs  11/02/16 2023 11/04/16 0415  NA 138 137  K 3.9 4.3  CL 103 102  CO2 26 27  BUN 16 12  CREATININE 0.56 0.66  GLUCOSE 115* 132*  CALCIUM 8.9 8.1*   No results for input(s): LABPT, INR in the last 72 hours.  EXAM General - Patient is Alert, Appropriate and Oriented Extremity - Neurologically intact Neurovascular intact Sensation intact distally Intact pulses distally Dorsiflexion/Plantar flexion intact No cellulitis present Compartment soft Dressing - dressing C/D/I Motor Function - intact, moving foot and toes well on exam.    History reviewed. No pertinent past medical history.  Assessment/Plan: 1 Day Post-Op Procedure(s) (LRB): OPEN REDUCTION INTERNAL FIXATION (ORIF) ANKLE FRACTURE (Left) Active Problems:   Fracture dislocation of right ankle  Estimated body mass index  is 37.25 kg/m as calculated from the following:   Height as of this encounter: 5\' 4"  (1.626 m).   Weight as of this encounter: 98.4 kg (217 lb). Advance diet Up with therapy D/C IV fluids Discharge home with home health today. If she has never used crutches before, pt will need to do therapy before leaving  Labs: were reviewed DVT Prophylaxis - Lovenox and TED hose Non weight bearing to right leg D/C O2 and Pulse OX and try on Room Air Post op instruction have been written  Cymone Yeske [facility] PA [facility] 11/04/2016, 7:53 AM

## 2016-11-04 NOTE — Progress Notes (Signed)
Clinical Social Worker (CSW) received SNF consult. PT is recommending home health. RN case manager aware of above. Please reconsult if future social work needs arise. CSW signing off.   Joesph Marcy, LCSW [phone] 

## 2016-11-04 NOTE — Anesthesia Postprocedure Evaluation (Signed)
Anesthesia Post Note  Patient: Margaret [name]  Procedure(s) Performed: Procedure(s) (LRB): OPEN REDUCTION INTERNAL FIXATION (ORIF) ANKLE FRACTURE (Left)  Patient location during evaluation: PACU Anesthesia Type: General Level of consciousness: awake and alert Pain management: pain level controlled Vital Signs Assessment: post-procedure vital signs reviewed and stable Respiratory status: spontaneous breathing, nonlabored ventilation, respiratory function stable and patient connected to nasal cannula oxygen Cardiovascular status: blood pressure returned to baseline and stable Postop Assessment: no signs of nausea or vomiting Anesthetic complications: no     Last Vitals:  Vitals:   11/03/16 2201 11/03/16 2301  BP: (!) 109/56 (!) 115/57  Pulse: 75 62  Resp:    Temp:      Last Pain:  Vitals:   11/04/16 0128  TempSrc:   PainSc: Asleep                 Cailah Reach S

## 2016-11-04 NOTE — Progress Notes (Signed)
DISCHARGE NOTE:  Pt given discharge instructions and prescriptions (lovenox, dilaudid). Pt verbalized understanding. Pt wheeled to car by staff.

## 2016-11-04 NOTE — Progress Notes (Signed)
Script for knee scooter [facility] and given to patient.

## 2016-11-04 NOTE — Discharge Summary (Signed)
Physician Discharge Summary  Patient ID: Margaret [name] Huntsman MRN: [id number] DOB/AGE: 50-Dec-1967 50 y.o.  Admit date: 11/02/2016 Discharge date: 11/04/2016  Admission Diagnoses:  Closed trimalleolar fracture of right ankle, initial encounter [S82.851A]   Discharge Diagnoses: Patient Active Problem List   Diagnosis Date Noted  . Fracture dislocation of right ankle 11/02/2016  . SMOKER 06/13/2010    History reviewed. No pertinent past medical history.   Transfusion: none   Consultants (if any): none  Discharged Condition: Improved  Hospital Course: Margaret [name] Margaret [name] is an 51 y.o. female who was admitted 11/02/2016 with [name] diagnosis of open bimalleolar right ankle fracture and went to the operating room on 11/02/2016 - 11/03/2016 and underwent the above named procedures.    Surgeries:Procedure(s): OPEN REDUCTION INTERNAL FIXATION (ORIF) ANKLE FRACTURE on 11/02/2016 - 11/03/2016  Pre-Op Diagnosis:   Closed unstable bimalleolar fracture dislocation, right ankle.  Post-Op Diagnosis:   Same.  Procedure:   Open reduction and internal fixation of unstable distal fibular fracture, right ankle.  Surgeon:   Maryagnes AmosJ. Jeffrey Poggi, MD  Assistant:   None  Anesthesia:   General LMA  Findings:   As above.  Complications:   None  EBL:   10 cc  Fluids:   400 cc crystalloid  UOP:   None  TT:   46 min at 250 mmHg  Drains:   None  Closure:   Staples  Implants:   Biomet 3.5 mm cortical screws x3  Brief Clinical Note:   The patient is [name] 51 year old female who sustained the above-noted injury last evening when she stepped into [name] hole in her yard. She was brought to the emergency room where the right ankle fracture dislocation was reduced and temporary stabilized in [name] splint. She was admitted for pain control and presents at this time for definitive management of her injury. Patient tolerated the surgery well. No complications .Patient was taken to PACU where she was stabilized and then  transferred to the orthopedic floor.  Patient started on Lovenox 40mg  q 24 hrs.   Heels elevated off bed with rolled towels. No evidence of DVT. Calves non tender. Negative Homan.   She was given perioperative antibiotics:  Anti-infectives    Start     Dose/Rate Route Frequency Ordered Stop   11/03/16 2030  ceFAZolin (ANCEF) IVPB 2 g/50 mL premix     2 g 100 mL/hr over 30 Minutes Intravenous Every 6 hours 11/03/16 1928 11/04/16 1429   11/03/16 1930  ceFAZolin (ANCEF) IVPB 2g/100 mL premix  Status:  Discontinued     2 g 200 mL/hr over 30 Minutes Intravenous Every 6 hours 11/03/16 1921 11/03/16 1928   11/03/16 0000  ceFAZolin (ANCEF) IVPB 2g/100 mL premix  Status:  Discontinued     2 g 200 mL/hr over 30 Minutes Intravenous  Once 11/02/16 2029 11/02/16 2137   11/03/16 0000  ceFAZolin (ANCEF) powder 2 g  Status:  Discontinued     2 g Other To Surgery 11/02/16 2137 11/02/16 2138   11/03/16 0000  ceFAZolin (ANCEF) IVPB 2g/100 mL premix  Status:  Discontinued     2 g 200 mL/hr over 30 Minutes Intravenous To Surgery 11/02/16 2138 11/02/16 2139   11/03/16 0000  dextrose 5 % 50 mL with ceFAZolin (ANCEF) 2 g  Status:  Discontinued     2 g 100 mL/hr  Other To Surgery 11/02/16 2139 11/03/16 1921    .  She was instructed on heel pump, early ambulation, and fitted with TED stocking  on the non operative leg for DVT prophylaxis.  She benefited maximally from the hospital stay and there were no complications.    Recent vital signs:  Vitals:   11/04/16 0302 11/04/16 0401  BP: (!) 103/55 (!) 97/59  Pulse: 70 66  Resp:    Temp:      Recent laboratory studies:  Lab Results  Component Value Date   HGB 14.8 11/02/2016   Lab Results  Component Value Date   WBC 15.7 (H) 11/02/2016   PLT 261 11/02/2016   No results found for: INR Lab Results  Component Value Date   NA 137 11/04/2016   K 4.3 11/04/2016   CL 102 11/04/2016   CO2 27 11/04/2016   BUN 12 11/04/2016   CREATININE 0.66  11/04/2016   GLUCOSE 132 (H) 11/04/2016    Discharge Medications:   Allergies as of 11/04/2016      Reactions   Tramadol Other (See Comments)   Codeine    Oxycodone-acetaminophen Other (See Comments)   Other reaction      Medication List    TAKE these medications   enoxaparin 40 MG/0.4ML injection Commonly known as:  LOVENOX Inject 0.4 mLs (40 mg total) into the skin daily.   HYDROmorphone 2 MG tablet Commonly known as:  DILAUDID Take 1-2 tablets (2-4 mg total) by mouth every 4 (four) hours as needed for moderate pain or severe pain.       Diagnostic Studies: Dg Tibia/fibula Right  Result Date: 11/02/2016 CLINICAL DATA:  Initial encounter for Pt states she stepped in [name] whole today and her RT foot "turned"; pt with c/o RT lower tib/fib pain and deformity; pt's RT ankle naturally in lateral position while RT knee is in AP position at time of x-ray EXAM: RIGHT TIBIA AND FIBULA - 2 VIEW COMPARISON:  None. FINDINGS: This bimalleolar comminuted fracture dislocation involving the ankle with posterior and lateral displacement of the talus relative to the tibia. Talar dome intact. More proximal tibia and fibula within normal limits. IMPRESSION: Trimalleolar fracture dislocation about the ankle. At minimum, this warrants dedicated ankle radiographs. Alternatively, CT should be considered. Electronically Signed   By: Jeronimo Greaves M.D.   On: 11/02/2016 18:33   Dg Ankle 2 Views Left  Result Date: 11/03/2016 CLINICAL DATA:  ORIF ankle fracture EXAM: DG C-ARM 61-120 MIN; LEFT ANKLE - 2 VIEW COMPARISON:  11/02/2016 FINDINGS: Three low resolution spot intraoperative views of the right ankle are submitted. Total fluoroscopy time was 6 seconds. The images demonstrate 3 screw fixation of the distal fibular fracture with anatomic alignment. Medial malleolar and posterior malleolar fractures are also evident. IMPRESSION: Intraoperative fluoroscopic assistance provided during ORIF of the right ankle.  Electronically Signed   By: Jasmine Pang M.D.   On: 11/03/2016 18:57   Dg Ankle Complete Left  Addendum Date: 11/02/2016   ADDENDUM REPORT: 11/02/2016 23:37 ADDENDUM: The study is resubmitted with the correct order for [name] right ankle study (originally input as [name] left ankle). The original report is correct for the right ankle and this addendum is to address the initial side disparity on the order. Electronically Signed   By: Tollie Eth M.D.   On: 11/02/2016 23:37   Result Date: 11/02/2016 CLINICAL DATA:  Post reduction of trimalleolar fracture of the right ankle. EXAM: RIGHT ANKLE COMPLETE - 3+ VIEW COMPARISON:  Same day pre reduction images. FINDINGS: The ankle mortise is now near anatomic after reduction. Comminuted oblique fracture of the distal fibula is identified with  slight medial and dorsal displacement of the distal fracture fragments by 1/4 shaft width. 4 mm of posterior displacement and 3 mm of depression involving the posterior malleolar fracture fragment. Medial malleolar fracture is not well visualized. There is slight widening of the medial ankle mortise up to 5 mm. New fiberglass cast projects over the ankle. IMPRESSION: Status post reduction of trimalleolar fracture - dislocation of right ankle with improved alignment demonstrated as above. Electronically Signed: By: Tollie Eth M.D. On: 11/02/2016 19:43   Dg Ankle Complete Right  Result Date: 11/02/2016 CLINICAL DATA:  Post reduction of trimalleolar fracture of the right ankle. EXAM: RIGHT ANKLE - COMPLETE 3+ VIEW COMPARISON:  Same day pre reduction images. FINDINGS: The ankle mortise is now near anatomic after reduction. Comminuted oblique fracture of the distal fibula is identified with slight medial and dorsal displacement of the distal fracture fragments by 1/4 shaft width. 4 mm of posterior displacement and 3 mm of depression involving the posterior malleolar fracture fragment. Medial malleolar fracture is not well visualized. There is  slight widening of the medial ankle mortise up to 5 mm. New fiberglass cast projects over the ankle. IMPRESSION: Status post reduction of trimalleolar fracture - dislocation of right ankle with improved alignment demonstrated as above. Electronically Signed   By: Tollie Eth M.D.   On: 11/02/2016 20:23   Dg C-arm 1-60 Min  Result Date: 11/03/2016 CLINICAL DATA:  ORIF ankle fracture EXAM: DG C-ARM 61-120 MIN; LEFT ANKLE - 2 VIEW COMPARISON:  11/02/2016 FINDINGS: Three low resolution spot intraoperative views of the right ankle are submitted. Total fluoroscopy time was 6 seconds. The images demonstrate 3 screw fixation of the distal fibular fracture with anatomic alignment. Medial malleolar and posterior malleolar fractures are also evident. IMPRESSION: Intraoperative fluoroscopic assistance provided during ORIF of the right ankle. Electronically Signed   By: Jasmine Pang M.D.   On: 11/03/2016 18:57    Disposition: Final discharge disposition not confirmed  Discharge Instructions    Diet - low sodium heart healthy    Complete by:  As directed    Increase activity slowly    Complete by:  As directed          Signed: WOLFE,JON R. 11/04/2016, 7:42 AM

## 2016-11-04 NOTE — Evaluation (Signed)
Physical Therapy Evaluation Patient Details Name: Margaret [name] MRN: [id number] DOB: 1966/07/04 Today's Date: 11/04/2016   History of Present Illness  Pt is an 51 y.o. female who was admitted 11/02/2016 with a diagnosis of open bimalleolar right ankle fracture and went to the operating room on 11/02/2016 - 11/03/2016 and underwent ORIF.    Clinical Impression  Pt presents with deficits in strength, transfers, gait, balance, and activity tolerance.  Pt Ind with bed mobility without need of extra time and effort to complete tasks.  Extensive transfer training provided including sit to/from stand transfers and stand-pivot transfers to/from various surfaces including bed, chair, and knee scooter.  Pt required CGA and verbal cues for correct sequencing during transfers and did show good progress during session with improved confidence and sequencing towards end of session.  Gait training provided with RW with pt able to amb forwards and backwards with multiple sessions of 5-10' with fair stability at first but progressing towards good stability with practice.  Pt able to navigate knee scooter with slow speeds 2 x 60' with fair stability with pt opting for w/c over knee scooter at end of session.  Extensive step training provided with various strategies including BUEs on one rail, one rail and one crutch, and seated.  Pt fearful/anxious and did present with mod instability during attempts to ascend steps in standing but felt safe scooting up in seated position.  Pt reports feeling confident regarding ascending/descending steps upon discharge.  Pt will benefit from PT services to address above deficits for decreased caregiver assistance upon discharge.      Follow Up Recommendations Home health PT    Equipment Recommendations  Rolling walker with 5" wheels    Recommendations for Other Services       Precautions / Restrictions Precautions Precautions: Fall Restrictions Weight Bearing Restrictions: Yes RLE  Weight Bearing: Non weight bearing      Mobility  Bed Mobility Overal bed mobility: Independent                Transfers Overall transfer level: Needs assistance Equipment used: Rolling walker (2 wheeled) Transfers: Sit to/from [employer] Transfers Sit to Stand: Min guard Stand pivot transfers: Min guard       General transfer comment: Min-mod verbal cues for sequencing  Ambulation/Gait Ambulation/Gait assistance: Min guard Ambulation Distance (Feet): 10 Feet Assistive device: Rolling walker (2 wheeled)     Gait velocity interpretation: Below normal speed for age/gender General Gait Details: Hop-to gait with min-mod instability that improved during session  Stairs Stairs: Yes Stairs assistance: Mod assist Stair Management: One rail Right;Seated/boosting;With crutches Number of Stairs: 4 General stair comments: Stair training with muliple strategies including BUEs on one rail, one rail and one crutch, and seated.  Pt fearful/anxious and did present with mod instability during attempts to ascend steps in standing but felt safe scooting up in seated position.  Wheelchair Mobility    Modified Rankin (Stroke Patients Only)       Balance Overall balance assessment: Needs assistance Sitting-balance support: No upper extremity supported Sitting balance-Leahy Scale: Normal     Standing balance support: Bilateral upper extremity supported Standing balance-Leahy Scale: Fair                               Pertinent Vitals/Pain Pain Assessment: 0-10 Pain Score: 6  Pain Descriptors / Indicators: Aching Pain Intervention(s): Patient requesting pain meds-RN notified;Monitored during session;Limited activity within patient's tolerance  Home Living Family/patient expects to be discharged to:: Private residence Living Arrangements: Spouse/significant other Available Help at Discharge: Family Type of Home: House Home Access: Stairs to  enter Entrance Stairs-Rails: Doctor, general practiceight;Left Entrance Stairs-Number of Steps: 4 Home Layout: One level Home Equipment: Wheelchair - manual      Prior Function Level of Independence: Independent         Comments: Pt ind with amb without AD community distances, works FT, ind with ADLs, no other recent falls     Hand Dominance   Dominant Hand: Right    Extremity/Trunk Assessment        Lower Extremity Assessment Lower Extremity Assessment: Generalized weakness       Communication   Communication: No difficulties  Cognition Arousal/Alertness: Awake/alert Behavior During Therapy: WFL for tasks assessed/performed Overall Cognitive Status: Within Functional Limits for tasks assessed                      General Comments      Exercises     Assessment/Plan    PT Assessment Patient needs continued PT services  PT Problem List Decreased strength;Decreased activity tolerance;Decreased balance;Decreased knowledge of use of DME       PT Treatment Interventions DME instruction;Gait training;Stair training;Neuromuscular re-education;Balance training;Therapeutic exercise;Therapeutic activities;Patient/family education    PT Goals (Current goals can be found in the Care Plan section)  Acute Rehab PT Goals Patient Stated Goal: To transfer to/from the toilet better PT Goal Formulation: With patient Time For Goal Achievement: 11/17/16 Potential to Achieve Goals: Good    Frequency BID   Barriers to discharge        Co-evaluation               End of Session Equipment Utilized During Treatment: Gait belt Activity Tolerance: Patient limited by pain Patient left: in bed;with call bell/phone within reach;with family/visitor present Nurse Communication: Mobility status PT Visit Diagnosis: Other abnormalities of gait and mobility (R26.89);Muscle weakness (generalized) (M62.81)         Time: [id number] PT Time Calculation (min) (ACUTE ONLY): 66  min   Charges:   PT Evaluation $PT Eval Low Complexity: 1 Procedure PT Treatments $Gait Training: 38-52 mins $Therapeutic Activity: 8-22 mins   PT G Codes:         Elly Modena. Snoke Margaret [name] PT, DPT 11/04/16, 1:39 PM

## 2016-11-04 NOTE — Discharge Instructions (Signed)
Elevate operative leg as much as possible  Pt is to be non weight bearing Do not get the operative leg wet. Call the office for an appointment for one week Lovenox 40mg  every morning Dilaudid 2-4 mg every 4-6 hurs. Using sparingly. Do to the new opioid laws refills will probably not be given Tylenol 1-2 tablets every 6 hrs. As needed. No more than 4000 mg per day Ice to operative leg as much as possible

## 2016-11-04 NOTE — Care Management (Signed)
Patient uses [facility] [address] [phone]. Called Lovenox 40 mg # 14 (per Cletis AthensJon), no refills. Clarified with Cletis AthensJon, PA that he did not want patient to have PT if Dr. Joice LoftsPoggi didn't order it. Not ordered.  Cost of Lovenox is $ 142.83. Patient agreeable to copay and states she can afford it.

## 2017-07-31 ENCOUNTER — Other Ambulatory Visit: Payer: Self-pay | Admitting: Internal Medicine

## 2017-07-31 DIAGNOSIS — Z1231 Encounter for screening mammogram for malignant neoplasm of breast: Secondary | ICD-10-CM

## 2017-08-26 ENCOUNTER — Ambulatory Visit
Admission: RE | Admit: 2017-08-26 | Discharge: 2017-08-26 | Disposition: A | Discharge status: Home / Self Care | Payer: 59 | Source: Ambulatory Visit | Attending: Internal Medicine | Admitting: Internal Medicine

## 2017-08-26 DIAGNOSIS — Z1231 Encounter for screening mammogram for malignant neoplasm of breast: Secondary | ICD-10-CM | POA: Diagnosis present

## 2017-08-28 ENCOUNTER — Other Ambulatory Visit: Payer: Self-pay | Admitting: Internal Medicine

## 2017-08-28 DIAGNOSIS — N6310 Unspecified lump in the right breast, unspecified quadrant: Secondary | ICD-10-CM

## 2017-08-28 DIAGNOSIS — R928 Other abnormal and inconclusive findings on diagnostic imaging of breast: Secondary | ICD-10-CM

## 2017-09-01 HISTORY — PX: BREAST BIOPSY: SHX20

## 2017-09-03 ENCOUNTER — Ambulatory Visit
Admission: RE | Admit: 2017-09-03 | Discharge: 2017-09-03 | Disposition: A | Discharge status: Home / Self Care | Payer: Self-pay | Source: Ambulatory Visit | Attending: Internal Medicine | Admitting: Internal Medicine

## 2017-09-03 DIAGNOSIS — N6310 Unspecified lump in the right breast, unspecified quadrant: Secondary | ICD-10-CM

## 2017-09-03 DIAGNOSIS — R928 Other abnormal and inconclusive findings on diagnostic imaging of breast: Secondary | ICD-10-CM

## 2017-09-03 DIAGNOSIS — N6311 Unspecified lump in the right breast, upper outer quadrant: Secondary | ICD-10-CM | POA: Insufficient documentation

## 2017-09-04 ENCOUNTER — Other Ambulatory Visit: Payer: Self-pay | Admitting: Internal Medicine

## 2017-09-04 DIAGNOSIS — N6310 Unspecified lump in the right breast, unspecified quadrant: Secondary | ICD-10-CM

## 2017-09-04 DIAGNOSIS — R928 Other abnormal and inconclusive findings on diagnostic imaging of breast: Secondary | ICD-10-CM

## 2017-09-08 ENCOUNTER — Ambulatory Visit
Admission: RE | Admit: 2017-09-08 | Discharge: 2017-09-08 | Disposition: A | Discharge status: Home / Self Care | Payer: 59 | Source: Ambulatory Visit | Attending: Internal Medicine | Admitting: Internal Medicine

## 2017-09-08 DIAGNOSIS — N6310 Unspecified lump in the right breast, unspecified quadrant: Secondary | ICD-10-CM

## 2017-09-08 DIAGNOSIS — R928 Other abnormal and inconclusive findings on diagnostic imaging of breast: Secondary | ICD-10-CM

## 2017-09-09 LAB — SURGICAL PATHOLOGY

## 2019-04-04 ENCOUNTER — Other Ambulatory Visit: Payer: Self-pay

## 2019-04-04 DIAGNOSIS — Z20822 Contact with and (suspected) exposure to covid-19: Secondary | ICD-10-CM

## 2019-04-05 LAB — NOVEL CORONAVIRUS, NAA: SARS-CoV-2, NAA: NOT DETECTED

## 2019-06-06 ENCOUNTER — Other Ambulatory Visit: Payer: Self-pay | Admitting: Internal Medicine

## 2019-06-06 DIAGNOSIS — Z1231 Encounter for screening mammogram for malignant neoplasm of breast: Secondary | ICD-10-CM

## 2019-06-08 ENCOUNTER — Other Ambulatory Visit: Payer: Self-pay | Admitting: Internal Medicine

## 2019-06-08 DIAGNOSIS — N6310 Unspecified lump in the right breast, unspecified quadrant: Secondary | ICD-10-CM

## 2019-06-15 ENCOUNTER — Ambulatory Visit
Admission: RE | Admit: 2019-06-15 | Discharge: 2019-06-15 | Disposition: A | Discharge status: Home / Self Care | Payer: Managed Care, Other (non HMO) | Source: Ambulatory Visit | Attending: Internal Medicine | Admitting: Internal Medicine

## 2019-06-15 DIAGNOSIS — N6310 Unspecified lump in the right breast, unspecified quadrant: Secondary | ICD-10-CM | POA: Insufficient documentation

## 2019-06-21 ENCOUNTER — Ambulatory Visit: Payer: Self-pay

## 2020-06-13 ENCOUNTER — Other Ambulatory Visit: Payer: Self-pay | Admitting: Internal Medicine

## 2020-06-13 DIAGNOSIS — Z1231 Encounter for screening mammogram for malignant neoplasm of breast: Secondary | ICD-10-CM

## 2020-06-27 ENCOUNTER — Other Ambulatory Visit: Payer: Self-pay

## 2020-06-27 ENCOUNTER — Ambulatory Visit
Admission: RE | Admit: 2020-06-27 | Discharge: 2020-06-27 | Disposition: A | Discharge status: Home / Self Care | Payer: Managed Care, Other (non HMO) | Source: Ambulatory Visit | Attending: Internal Medicine | Admitting: Internal Medicine

## 2020-06-27 DIAGNOSIS — Z1231 Encounter for screening mammogram for malignant neoplasm of breast: Secondary | ICD-10-CM | POA: Diagnosis not present

## 2020-08-10 ENCOUNTER — Ambulatory Visit
Admission: EM | Admit: 2020-08-10 | Discharge: 2020-08-10 | Disposition: A | Discharge status: Home / Self Care | Payer: Managed Care, Other (non HMO)

## 2020-08-10 DIAGNOSIS — J040 Acute laryngitis: Secondary | ICD-10-CM

## 2020-08-10 MED ORDER — DEXAMETHASONE SODIUM PHOSPHATE 10 MG/ML IJ SOLN
10.0000 mg | Freq: Once | INTRAMUSCULAR | Status: AC
  Administered 2020-08-10: 10 mg via INTRAMUSCULAR

## 2020-08-10 NOTE — ED Triage Notes (Signed)
Pt reports having sore throat, L ear pain x3 days. sts she lost her voice this morning. "I think I have bronchitis".  sts she took a home covid test Wed and Thurs.

## 2020-08-10 NOTE — Discharge Instructions (Addendum)
Steroid injection given here.  You can continue over-the-counter medicines as needed. Follow up as needed for continued or worsening symptoms

## 2020-08-10 NOTE — ED Provider Notes (Signed)
Renaldo Fiddler    CSN: [id number] Arrival date & time: 08/10/20  1300      History   Chief Complaint Chief Complaint  Patient presents with  . Sore Throat    HPI Margaret [name] is a 54 y.o. female.   Pt is a 54 year old female that presents with right ear pain, laryngitis, cough x 3 days. Taking OTC meds with some relief. Possible low grade fever. Some fatigue. No chest pain, SOB.      History reviewed. No pertinent past medical history.  Patient Active Problem List   Diagnosis Date Noted  . Fracture dislocation of right ankle 11/02/2016  . SMOKER 06/13/2010    Past Surgical History:  Procedure Laterality Date  . ABDOMINAL HYSTERECTOMY    . BREAST BIOPSY Left    benign  . BREAST BIOPSY Right 2019   fibroadenoma bx/clip  . HYSTEROTOMY  2008  . ORIF ANKLE FRACTURE Left 11/03/2016   Procedure: OPEN REDUCTION INTERNAL FIXATION (ORIF) ANKLE FRACTURE;  Surgeon: Christena Flake, MD;  Location: [facility] ORS;  Service: Orthopedics;  Laterality: Left;    OB History   No obstetric history on file.      Home Medications    Prior to Admission medications   Medication Sig Start Date End Date Taking? Authorizing Provider  doxycycline (VIBRAMYCIN) 50 MG/5ML SYRP Take by mouth 2 (two) times daily.    [provider]  enoxaparin (LOVENOX) 40 MG/0.4ML injection Inject 0.4 mLs (40 mg total) into the skin daily. 11/04/16 08/10/20  Tera Partridge, PA    Family History Family History  Problem Relation Age of Onset  . Breast cancer Neg Hx     Social History Social History   Tobacco Use  . Smoking status: Never Smoker  . Smokeless tobacco: Never Used  Substance Use Topics  . Alcohol use: Yes    Comment: occ     Allergies   Tramadol, Codeine, and Oxycodone-acetaminophen   Review of Systems Review of Systems   Physical Exam Triage Vital Signs ED Triage Vitals  Enc Vitals Group     BP 08/10/20 1306 137/83     Pulse Rate 08/10/20 1306 97     Resp  08/10/20 1306 16     Temp 08/10/20 1306 98.9 F (37.2 C)     Temp Source 08/10/20 1306 Oral     SpO2 08/10/20 1306 97 %     Weight 08/10/20 1308 215 lb (97.5 kg)     Height 08/10/20 1308 5\' 4"  (1.626 m)     Head Circumference --      Peak Flow --      Pain Score 08/10/20 1307 0     Pain Loc --      Pain Edu? --      Excl. in GC? --    No data found.  Updated Vital Signs BP 137/83   Pulse 97   Temp 98.9 F (37.2 C) (Oral)   Resp 16   Ht 5\' 4"  (1.626 m)   Wt 215 lb (97.5 kg)   SpO2 97%   BMI 36.90 kg/m   Visual Acuity Right Eye Distance:   Left Eye Distance:   Bilateral Distance:    Right Eye Near:   Left Eye Near:    Bilateral Near:     Physical Exam Vitals and nursing note reviewed.  Constitutional:      General: She is not in acute distress.    Appearance: Normal appearance. She  is not ill-appearing, toxic-appearing or diaphoretic.  HENT:     Head: Normocephalic.     Right Ear: Tympanic membrane and ear canal normal.     Left Ear: Tympanic membrane and ear canal normal.     Nose: Nose normal.     Mouth/Throat:     Pharynx: Oropharynx is clear. Posterior oropharyngeal erythema present.     Comments: Laryngitis  Eyes:     Conjunctiva/sclera: Conjunctivae normal.  Cardiovascular:     Rate and Rhythm: Normal rate and regular rhythm.  Pulmonary:     Effort: Pulmonary effort is normal.     Breath sounds: Normal breath sounds.  Musculoskeletal:        General: Normal range of motion.     Cervical back: Normal range of motion.  Skin:    General: Skin is warm and dry.     Findings: No rash.  Neurological:     Mental Status: She is alert.  Psychiatric:        Mood and Affect: Mood normal.      UC Treatments / Results  Labs (all labs ordered are listed, but only abnormal results are displayed) Labs Reviewed - No data to display  EKG   Radiology No results found.  Procedures Procedures (including critical care time)  Medications Ordered in  UC Medications  dexamethasone (DECADRON) injection 10 mg (10 mg Intramuscular Given 08/10/20 1325)    Initial Impression / Assessment and Plan / UC Course  I have reviewed the triage vital signs and the nursing notes.  Pertinent labs & imaging results that were available during my care of the patient were reviewed by me and considered in my medical decision making (see chart for details).     Laryngitis  Steroid injection given here.  No concern for underlying infection at this time. Over-the-counter medicines as needed for symptoms. Final Clinical Impressions(s) / UC Diagnoses   Final diagnoses:  Laryngitis, acute     Discharge Instructions     Steroid injection given here.  You can continue over-the-counter medicines as needed. Follow up as needed for continued or worsening symptoms     ED Prescriptions    None     PDMP not reviewed this encounter.   Janace Aris, NP 08/10/20 1333

## 2020-08-13 ENCOUNTER — Telehealth: Payer: Self-pay | Admitting: Family Medicine

## 2020-08-13 MED ORDER — AMOXICILLIN-POT CLAVULANATE 875-125 MG PO TABS: 1.0000 | ORAL_TABLET | Freq: Two times a day (BID) | ORAL | 0 refills | Status: AC

## 2020-08-13 NOTE — Telephone Encounter (Signed)
Worsening respiratory symptoms. Sending in abx

## 2020-09-14 ENCOUNTER — Ambulatory Visit
Admission: EM | Admit: 2020-09-14 | Discharge: 2020-09-14 | Disposition: A | Discharge status: Home / Self Care | Payer: Managed Care, Other (non HMO) | Attending: Family Medicine | Admitting: Family Medicine

## 2020-09-14 DIAGNOSIS — B349 Viral infection, unspecified: Secondary | ICD-10-CM | POA: Diagnosis not present

## 2020-09-14 DIAGNOSIS — Z1152 Encounter for screening for COVID-19: Secondary | ICD-10-CM

## 2020-09-14 NOTE — ED Triage Notes (Signed)
Pt began running fever Tuesday, was 103 yesterday, has c/o nasal congestion and right ear pain

## 2020-09-14 NOTE — Discharge Instructions (Addendum)
Covid test pending You can take OTC medicines as needed for symptoms Follow up as needed for continued or worsening symptoms

## 2020-09-17 NOTE — ED Provider Notes (Signed)
Renaldo Fiddler    CSN: [id number] Arrival date & time: 09/14/20  0946      History   Chief Complaint Chief Complaint  Patient presents with  . Nasal Congestion  . Fever  . Otalgia    HPI Margaret [name] is a 55 y.o. female.   Pt is a 55 year old female that presents with fever, max temp at home 103. He has had nasal congestion and right ear pain. Symptoms have been constant. Taking OTC meds as needed. No cough, chest pain and SOB.      History reviewed. No pertinent past medical history.  Patient Active Problem List   Diagnosis Date Noted  . Fracture dislocation of right ankle 11/02/2016  . SMOKER 06/13/2010    Past Surgical History:  Procedure Laterality Date  . ABDOMINAL HYSTERECTOMY    . BREAST BIOPSY Left    benign  . BREAST BIOPSY Right 2019   fibroadenoma bx/clip  . HYSTEROTOMY  2008  . ORIF ANKLE FRACTURE Left 11/03/2016   Procedure: OPEN REDUCTION INTERNAL FIXATION (ORIF) ANKLE FRACTURE;  Surgeon: Christena Flake, MD;  Location: [facility] ORS;  Service: Orthopedics;  Laterality: Left;    OB History   No obstetric history on file.      Home Medications    Prior to Admission medications   Medication Sig Start Date End Date Taking? Authorizing Provider  amoxicillin-clavulanate (AUGMENTIN) 875-125 MG tablet Take 1 tablet by mouth every 12 (twelve) hours. 08/13/20   Dany Walther, Gloris Manchester A, NP  enoxaparin (LOVENOX) 40 MG/0.4ML injection Inject 0.4 mLs (40 mg total) into the skin daily. 11/04/16 08/10/20  Tera Partridge, PA    Family History Family History  Problem Relation Age of Onset  . Breast cancer Neg Hx     Social History Social History   Tobacco Use  . Smoking status: Never Smoker  . Smokeless tobacco: Never Used  Substance Use Topics  . Alcohol use: Yes    Comment: occ     Allergies   Tramadol, Codeine, and Oxycodone-acetaminophen   Review of Systems Review of Systems   Physical Exam Triage Vital Signs ED Triage Vitals  Enc Vitals Group      BP 09/14/20 1016 128/81     Pulse Rate 09/14/20 1016 (!) 106     Resp 09/14/20 1016 18     Temp 09/14/20 1016 98.3 F (36.8 C)     Temp Source 09/14/20 1016 Oral     SpO2 09/14/20 1016 95 %     Weight --      Height --      Head Circumference --      Peak Flow --      Pain Score 09/14/20 1031 6     Pain Loc --      Pain Edu? --      Excl. in GC? --    No data found.  Updated Vital Signs BP 128/81 (BP Location: Left Arm)   Pulse (!) 106   Temp 98.3 F (36.8 C) (Oral)   Resp 18   SpO2 95%   Visual Acuity Right Eye Distance:   Left Eye Distance:   Bilateral Distance:    Right Eye Near:   Left Eye Near:    Bilateral Near:     Physical Exam Vitals and nursing note reviewed.  Constitutional:      General: She is not in acute distress.    Appearance: Normal appearance. She is not ill-appearing, toxic-appearing or diaphoretic.  HENT:     Head: Normocephalic.     Right Ear: Tympanic membrane and ear canal normal.     Left Ear: Tympanic membrane and ear canal normal.     Nose: Nose normal.     Mouth/Throat:     Pharynx: Oropharynx is clear.  Eyes:     Conjunctiva/sclera: Conjunctivae normal.  Cardiovascular:     Rate and Rhythm: Normal rate and regular rhythm.  Pulmonary:     Effort: Pulmonary effort is normal.     Breath sounds: Normal breath sounds.  Abdominal:     Palpations: Abdomen is soft.     Tenderness: There is no abdominal tenderness.  Musculoskeletal:        General: Normal range of motion.     Cervical back: Normal range of motion.  Skin:    General: Skin is warm and dry.     Findings: No rash.  Neurological:     Mental Status: She is alert.  Psychiatric:        Mood and Affect: Mood normal.      UC Treatments / Results  Labs (all labs ordered are listed, but only abnormal results are displayed) Labs Reviewed  COVID-19, FLU A+B NAA    EKG   Radiology No results found.  Procedures Procedures (including critical care  time)  Medications Ordered in UC Medications - No data to display  Initial Impression / Assessment and Plan / UC Course  I have reviewed the triage vital signs and the nursing notes.  Pertinent labs & imaging results that were available during my care of the patient were reviewed by me and considered in my medical decision making (see chart for details).     Viral illness Nothing concerning on exam COVID test pending.  Recommend over-the-counter's as needed for symptoms. Follow up as needed for continued or worsening symptoms  Final Clinical Impressions(s) / UC Diagnoses   Final diagnoses:  Viral illness     Discharge Instructions     Covid test pending You can take OTC medicines as needed for symptoms Follow up as needed for continued or worsening symptoms    ED Prescriptions    None     PDMP not reviewed this encounter.   Janace Aris, NP 09/17/20 1448

## 2020-09-18 LAB — COVID-19, FLU A+B NAA
Influenza A, NAA: NOT DETECTED
Influenza B, NAA: NOT DETECTED
SARS-CoV-2, NAA: DETECTED — AB

## 2021-08-09 ENCOUNTER — Ambulatory Visit: Admit: 2021-08-09 | Payer: Managed Care, Other (non HMO)

## 2021-08-10 ENCOUNTER — Ambulatory Visit (INDEPENDENT_AMBULATORY_CARE_PROVIDER_SITE_OTHER): Payer: Managed Care, Other (non HMO)

## 2021-08-10 ENCOUNTER — Other Ambulatory Visit: Payer: Self-pay

## 2021-08-10 ENCOUNTER — Ambulatory Visit
Admission: EM | Admit: 2021-08-10 | Discharge: 2021-08-10 | Disposition: A | Discharge status: Home / Self Care | Payer: Managed Care, Other (non HMO)

## 2021-08-10 VITALS — BP 130/88 | HR 108 | Temp 98.7°F | Resp 18

## 2021-08-10 DIAGNOSIS — R509 Fever, unspecified: Secondary | ICD-10-CM | POA: Diagnosis not present

## 2021-08-10 DIAGNOSIS — R079 Chest pain, unspecified: Secondary | ICD-10-CM | POA: Diagnosis not present

## 2021-08-10 LAB — POCT INFLUENZA A/B
Influenza A, POC: NEGATIVE
Influenza B, POC: NEGATIVE

## 2021-08-10 NOTE — ED Triage Notes (Signed)
Pt c/o fever off and on x 3 days. She also has mid back pain. She had a urinalysis performed at her PCP that was negative.

## 2021-08-10 NOTE — Discharge Instructions (Addendum)
Your chest x-ray is negative for pneumonia. Your flu test is also negative. If you fevers persist, follow-up with PCP for additional work-up. If any new severe symptoms such as difficulty breathing, chest pain, or severe abdominal pain develop, go immediately to the emergency department . Continue Tylenol and Ibuprofen.

## 2021-08-10 NOTE — ED Provider Notes (Signed)
[facility]    CSN: [id number] Arrival date & time: 08/10/21  0900      History   Chief Complaint Chief Complaint  Patient presents with   Fever    HPI Margaret [name] is a 55 y.o. female.   HPI Patient presents today with intermittent fevers x 5 days with (104 F. TMAX). She reports associated mid-back pain, poor appetite, altered taste, nausea w/o vomiting.  Seen by PCP this week and had a negative UTI work-up.  Fevers are intermittent followed by chills and awakening in wet sweats. No known sick contacts.  No associated respiratory symptoms.  History reviewed. No pertinent past medical history.  Patient Active Problem List   Diagnosis Date Noted   Fracture dislocation of right ankle 11/02/2016   SMOKER 06/13/2010    Past Surgical History:  Procedure Laterality Date   ABDOMINAL HYSTERECTOMY     BREAST BIOPSY Left    benign   BREAST BIOPSY Right 2019   fibroadenoma bx/clip   HYSTEROTOMY  2008   ORIF ANKLE FRACTURE Left 11/03/2016   Procedure: OPEN REDUCTION INTERNAL FIXATION (ORIF) ANKLE FRACTURE;  Surgeon: Christena Flake, MD;  Location: [facility] OR[facility];  Service: Orthopedics;  Laterality: Left;    OB History   No obstetric history on file.      Home Medications    Prior to Admission medications   Medication Sig Start Date End Date Taking? Authorizing Provider  doxycycline (MONODOX) 100 MG capsule Take 1 tablet 2x/day with food for rosacea 08/18/19  Yes [provider]  amoxicillin-clavulanate (AUGMENTIN) 875-125 MG tablet Take 1 tablet by mouth every 12 (twelve) hours. 08/13/20   Bast, Gloris Manchester A, NP  enoxaparin (LOVENOX) 40 MG/0.4ML injection Inject 0.4 mLs (40 mg total) into the skin daily. 11/04/16 08/10/20  Tera Partridge, PA    Family History Family History  Problem Relation Age of Onset   Breast cancer Neg Hx     Social History Social History   Tobacco Use   Smoking status: Never   Smokeless tobacco: Never  Vaping Use   Vaping Use: Never  used  Substance Use Topics   Alcohol use: Yes    Comment: occ   Drug use: Never     Allergies   Tramadol, Codeine, and Oxycodone-acetaminophen   Review of Systems Review of Systems Pertinent negatives listed in HPI   Physical Exam Triage Vital Signs ED Triage Vitals  Enc Vitals Group     BP 08/10/21 0909 130/88     Pulse Rate 08/10/21 0909 (!) 108     Resp 08/10/21 0909 18     Temp 08/10/21 0909 98.7 F (37.1 C)     Temp Source 08/10/21 0909 Oral     SpO2 08/10/21 0909 94 %     Weight --      Height --      Head Circumference --      Peak Flow --      Pain Score 08/10/21 0908 0     Pain Loc --      Pain Edu? --      Excl. in GC? --    No data found.  Updated Vital Signs BP 130/88 (BP Location: Left Arm)   Pulse (!) 108   Temp 98.7 F (37.1 C) (Oral)   Resp 18   SpO2 94%   Visual Acuity Right Eye Distance:   Left Eye Distance:   Bilateral Distance:    Right Eye Near:   Left  Eye Near:    Bilateral Near:     Physical Exam Constitutional:      General: She is not in acute distress.    Appearance: She is obese. She is diaphoretic. She is not toxic-appearing.  HENT:     Head: Normocephalic and atraumatic.     Right Ear: Tympanic membrane normal.     Left Ear: Tympanic membrane normal.  Eyes:     Extraocular Movements: Extraocular movements intact.     Pupils: Pupils are equal, round, and reactive to light.  Cardiovascular:     Rate and Rhythm: Regular rhythm. Tachycardia present.  Pulmonary:     Effort: Pulmonary effort is normal.     Breath sounds: Normal breath sounds.  Musculoskeletal:     Cervical back: Normal range of motion.  Lymphadenopathy:     Cervical: No cervical adenopathy.  Neurological:     General: No focal deficit present.     Mental Status: She is alert and oriented to person, place, and time.  Psychiatric:        Mood and Affect: Mood normal.        Behavior: Behavior normal.     UC Treatments / Results  Labs (all labs  ordered are listed, but only abnormal results are displayed) Labs Reviewed  POCT INFLUENZA A/B    EKG   Radiology DG Chest 2 View  Result Date: 08/10/2021 CLINICAL DATA:  Fever, chest tightness. EXAM: CHEST - 2 VIEW COMPARISON:  February 21, 2015. FINDINGS: The heart size and mediastinal contours are within normal limits. Both lungs are clear. The visualized skeletal structures are unremarkable. IMPRESSION: No active cardiopulmonary disease. Electronically Signed   By: Lupita Raider M.D.   On: 08/10/2021 10:09    Procedures Procedures (including critical care time)  Medications Ordered in UC Medications - No data to display  Initial Impression / Assessment and Plan / UC Course  I have reviewed the triage vital signs and the nursing notes.  Pertinent labs & imaging results that were available during my care of the patient were reviewed by me and considered in my medical decision making (see chart for details).    Chest x-ray ordered to rule out a possible pneumonia which revealed a negative cardiopulmonary work-up.  Tested for flu, rapid point-of-care was negative.  Patient tested for COVID at home, home test is also been negative. Febrile illness of unknown etiology.  Patient encouraged to follow back up with primary care providers if fever persist for additional work-up and evaluation of the cause of fevers.  Continue Tylenol and ibuprofen in the meantime.  ER if any severe symptoms develop. Final Clinical Impressions(s) / UC Diagnoses   Final diagnoses:  Febrile illness     Discharge Instructions      Your chest x-ray is negative for pneumonia. Your flu test is also negative. If you fevers persist, follow-up with PCP for additional work-up. If any new severe symptoms such as difficulty breathing, chest pain, or severe abdominal pain develop, go immediately to the emergency department . Continue Tylenol and Ibuprofen.     ED Prescriptions   None    PDMP not reviewed  this encounter.   Bing Neighbors, FNP 08/10/21 1050

## 2022-03-11 ENCOUNTER — Other Ambulatory Visit: Payer: Self-pay | Admitting: Internal Medicine

## 2022-03-11 DIAGNOSIS — Z1231 Encounter for screening mammogram for malignant neoplasm of breast: Secondary | ICD-10-CM

## 2022-04-08 ENCOUNTER — Ambulatory Visit
Admission: RE | Admit: 2022-04-08 | Discharge: 2022-04-08 | Disposition: A | Discharge status: Home / Self Care | Payer: Managed Care, Other (non HMO) | Source: Ambulatory Visit | Attending: Internal Medicine | Admitting: Internal Medicine

## 2022-04-08 ENCOUNTER — Other Ambulatory Visit: Payer: Self-pay | Admitting: Internal Medicine

## 2022-04-08 DIAGNOSIS — R2231 Localized swelling, mass and lump, right upper limb: Secondary | ICD-10-CM

## 2022-04-08 DIAGNOSIS — Z1231 Encounter for screening mammogram for malignant neoplasm of breast: Secondary | ICD-10-CM | POA: Insufficient documentation

## 2022-04-28 ENCOUNTER — Ambulatory Visit
Admission: RE | Admit: 2022-04-28 | Discharge: 2022-04-28 | Disposition: A | Discharge status: Home / Self Care | Payer: Managed Care, Other (non HMO) | Source: Ambulatory Visit | Attending: Internal Medicine | Admitting: Internal Medicine

## 2022-04-28 ENCOUNTER — Other Ambulatory Visit: Payer: Self-pay | Admitting: Internal Medicine

## 2022-04-28 DIAGNOSIS — R2231 Localized swelling, mass and lump, right upper limb: Secondary | ICD-10-CM

## 2023-07-13 ENCOUNTER — Other Ambulatory Visit: Payer: Self-pay | Admitting: Internal Medicine

## 2023-07-13 DIAGNOSIS — Z1231 Encounter for screening mammogram for malignant neoplasm of breast: Secondary | ICD-10-CM

## 2023-07-14 ENCOUNTER — Ambulatory Visit
Admission: RE | Admit: 2023-07-14 | Discharge: 2023-07-14 | Disposition: A | Discharge status: Home / Self Care | Payer: Managed Care, Other (non HMO) | Source: Ambulatory Visit | Attending: Internal Medicine | Admitting: Internal Medicine

## 2023-07-14 DIAGNOSIS — Z1231 Encounter for screening mammogram for malignant neoplasm of breast: Secondary | ICD-10-CM | POA: Insufficient documentation

## 2024-06-14 ENCOUNTER — Other Ambulatory Visit: Payer: Self-pay | Admitting: Urgent Care

## 2024-06-14 DIAGNOSIS — Z1231 Encounter for screening mammogram for malignant neoplasm of breast: Secondary | ICD-10-CM

## 2024-07-19 ENCOUNTER — Ambulatory Visit
Admission: RE | Admit: 2024-07-19 | Discharge: 2024-07-19 | Disposition: A | Discharge status: Home / Self Care | Source: Ambulatory Visit | Attending: Urgent Care | Admitting: Urgent Care

## 2024-07-19 DIAGNOSIS — Z1231 Encounter for screening mammogram for malignant neoplasm of breast: Secondary | ICD-10-CM | POA: Insufficient documentation

## 2024-10-25 ENCOUNTER — Ambulatory Visit: Admitting: General Practice
